# Patient Record
Sex: Male | Born: 1945 | Race: White | Hispanic: No | Smoking: Former smoker
Health system: Southern US, Community
[De-identification: ages and names within clinical notes are randomized; demographics above are authoritative.]

## PROBLEM LIST (undated history)

## (undated) DIAGNOSIS — H3530 Unspecified macular degeneration: Secondary | ICD-10-CM

## (undated) DIAGNOSIS — Z8739 Personal history of other diseases of the musculoskeletal system and connective tissue: Secondary | ICD-10-CM

## (undated) DIAGNOSIS — Z9109 Other allergy status, other than to drugs and biological substances: Secondary | ICD-10-CM

## (undated) DIAGNOSIS — M1990 Unspecified osteoarthritis, unspecified site: Secondary | ICD-10-CM

## (undated) DIAGNOSIS — Z87442 Personal history of urinary calculi: Secondary | ICD-10-CM

## (undated) DIAGNOSIS — L309 Dermatitis, unspecified: Secondary | ICD-10-CM

## (undated) HISTORY — DX: Unspecified macular degeneration: H35.30

## (undated) HISTORY — PX: OTHER SURGICAL HISTORY: SHX169

---

## 1998-11-02 ENCOUNTER — Ambulatory Visit (HOSPITAL_COMMUNITY): Admission: RE | Admit: 1998-11-02 | Discharge: 1998-11-02 | Payer: Self-pay | Admitting: Gastroenterology

## 1998-11-02 ENCOUNTER — Encounter (INDEPENDENT_AMBULATORY_CARE_PROVIDER_SITE_OTHER): Payer: Self-pay | Admitting: Specialist

## 2003-08-31 ENCOUNTER — Encounter: Admission: RE | Admit: 2003-08-31 | Discharge: 2003-08-31 | Payer: Self-pay | Admitting: Gastroenterology

## 2007-10-07 ENCOUNTER — Ambulatory Visit (HOSPITAL_COMMUNITY): Admission: RE | Admit: 2007-10-07 | Discharge: 2007-10-07 | Payer: Self-pay | Admitting: Gastroenterology

## 2007-10-07 ENCOUNTER — Encounter (INDEPENDENT_AMBULATORY_CARE_PROVIDER_SITE_OTHER): Payer: Self-pay | Admitting: Gastroenterology

## 2010-09-10 NOTE — Op Note (Signed)
Clifford Garcia, Clifford Garcia             ACCOUNT NO.:  [id number]   MEDICAL RECORD NO.:  [id number]          PATIENT TYPE:  AMB   LOCATION:  ENDO                         FACILITY:  [facility]   PHYSICIAN:  Bernette Redbird, M.D.   DATE OF BIRTH:  03/18/1946   DATE OF PROCEDURE:  10/07/2007  DATE OF DISCHARGE:                               OPERATIVE REPORT   PROCEDURE:  Colonoscopy with polypectomy.   ENDOSCOPIST:  Bernette Redbird, M.D.   INDICATIONS:  A 65 year old for followup of prior colonic adenomas, last  colonoscoped 4-1/2 years ago.  More recently, he had had some rectal  bleeding which has now become quiescent.   FINDINGS:  Small polyp in the transverse colon, snared off, but main  portion of polyp lost.  Mild right-sided diverticulosis.   PROCEDURE:  The nature, purpose and risks of the procedure were familiar  to the patient from prior examination and he provided written consent.  Sedation was propofol by the anesthesia department.  Digital exam of the  prostate showed no discrete nodules or abnormalities.  The Pentax adult  video colonoscope was advanced very easily around the colon to the cecum  and for short distance into a normal-appearing terminal ileum, whereupon  pullback was performed.  The quality of prep was very good and it was  felt that all areas were well seen.   At 70 cm on pullback, felt to correspond to the distal transverse colon,  there was a 4- to 5-mm sessile polyp which I cold-snared.  The polyp  fragment was lost because it floated away and was not able to be  recovered, at least as of the time of this dictation, although the  technician was going to check the large suction trap to see if it may  have gone into it instead of into the small suction trap.  A little bit  of residual tissue, possibly residual polyp tissue, at the base of the  cold snare site was cold-biopsied off and submitted for histologic  analysis.  If located, we will also send the main  portion of the polyp,  which is still just a small fragment of tissue, along with that  specimen.   There were a few right-sided diverticula.   This was otherwise a normal exam.  No large polyps, cancer, colitis or  vascular malformations were noted and retroflexion in the rectum was  unremarkable.  The patient was re-colonoscoped all the way to the cecum  in an effort to locate the small lost polyp fragment, but this was not  successful.   The patient tolerated the procedure well and there were no apparent  complications.   IMPRESSION:  1. Small colon polyp removed by cold snare technique as described      above.  2. Prior history of colonic adenomas.  3. Mild right-sided diverticulosis as previously noted.   PLAN:  1. Await pathology results.  2. Anticipate followup colonoscopy in 5 years, in view of the prior      history of colonic adenomas.           ______________________________  Bernette Redbird, M.D.  RB/MEDQ  D:  10/07/2007  T:  10/07/2007  Job:  [id number]   cc:   Excell Seltzer. Annabell Howells, M.D.  Fax: [phone]

## 2011-01-23 LAB — BASIC METABOLIC PANEL
Calcium: 9.3
Chloride: 104
GFR calc non Af Amer: 58 — ABNORMAL LOW

## 2011-01-23 LAB — CBC
Hemoglobin: 16.6
MCHC: 35.8
RBC: 5.34
RDW: 13
WBC: 6

## 2012-02-27 ENCOUNTER — Other Ambulatory Visit: Payer: Self-pay | Admitting: Dermatology

## 2013-09-05 ENCOUNTER — Other Ambulatory Visit: Payer: Self-pay | Admitting: Gastroenterology

## 2013-10-07 ENCOUNTER — Encounter (HOSPITAL_COMMUNITY): Payer: Self-pay | Admitting: Pharmacy Technician

## 2013-10-13 ENCOUNTER — Encounter (HOSPITAL_COMMUNITY)
Admission: RE | Admit: 2013-10-13 | Discharge: 2013-10-13 | Disposition: A | Discharge status: Home / Self Care | Payer: BC Managed Care – PPO | Source: Ambulatory Visit | Attending: Orthopedic Surgery | Admitting: Orthopedic Surgery

## 2013-10-13 ENCOUNTER — Encounter (HOSPITAL_COMMUNITY): Payer: Self-pay

## 2013-10-13 DIAGNOSIS — Z01818 Encounter for other preprocedural examination: Secondary | ICD-10-CM | POA: Insufficient documentation

## 2013-10-13 DIAGNOSIS — Z01812 Encounter for preprocedural laboratory examination: Secondary | ICD-10-CM | POA: Insufficient documentation

## 2013-10-13 HISTORY — DX: Other allergy status, other than to drugs and biological substances: Z91.09

## 2013-10-13 HISTORY — DX: Personal history of other diseases of the musculoskeletal system and connective tissue: Z87.39

## 2013-10-13 HISTORY — DX: Unspecified osteoarthritis, unspecified site: M19.90

## 2013-10-13 HISTORY — DX: Dermatitis, unspecified: L30.9

## 2013-10-13 HISTORY — DX: Personal history of urinary calculi: Z87.442

## 2013-10-13 LAB — URINALYSIS, ROUTINE W REFLEX MICROSCOPIC
Bilirubin Urine: NEGATIVE
GLUCOSE, UA: NEGATIVE mg/dL
HGB URINE DIPSTICK: NEGATIVE
KETONES UR: NEGATIVE mg/dL
Leukocytes, UA: NEGATIVE
NITRITE: NEGATIVE
Protein, ur: NEGATIVE mg/dL
Specific Gravity, Urine: 1.015 (ref 1.005–1.030)
UROBILINOGEN UA: 0.2 mg/dL (ref 0.0–1.0)
pH: 5.5 (ref 5.0–8.0)

## 2013-10-13 LAB — PROTIME-INR
INR: 1 (ref 0.00–1.49)
PROTHROMBIN TIME: 13 s (ref 11.6–15.2)

## 2013-10-13 LAB — CBC
HCT: 43.6 % (ref 39.0–52.0)
HEMOGLOBIN: 15 g/dL (ref 13.0–17.0)
MCH: 31.1 pg (ref 26.0–34.0)
MCHC: 34.4 g/dL (ref 30.0–36.0)
MCV: 90.3 fL (ref 78.0–100.0)
Platelets: 143 10*3/uL — ABNORMAL LOW (ref 150–400)
RBC: 4.83 MIL/uL (ref 4.22–5.81)
RDW: 12.6 % (ref 11.5–15.5)
WBC: 5.5 10*3/uL (ref 4.0–10.5)

## 2013-10-13 LAB — BASIC METABOLIC PANEL
BUN: 17 mg/dL (ref 6–23)
CALCIUM: 9.7 mg/dL (ref 8.4–10.5)
CHLORIDE: 103 meq/L (ref 96–112)
CO2: 26 mEq/L (ref 19–32)
Creatinine, Ser: 1.13 mg/dL (ref 0.50–1.35)
GFR calc Af Amer: 74 mL/min — ABNORMAL LOW (ref >90)
GFR, EST NON AFRICAN AMERICAN: 64 mL/min — AB (ref >90)
GLUCOSE: 124 mg/dL — AB (ref 70–99)
POTASSIUM: 4.1 meq/L (ref 3.7–5.3)
SODIUM: 145 meq/L (ref 137–147)

## 2013-10-13 LAB — SURGICAL PCR SCREEN
MRSA, PCR: NEGATIVE
Staphylococcus aureus: NEGATIVE

## 2013-10-13 LAB — APTT: aPTT: 32 seconds (ref 24–37)

## 2013-10-13 NOTE — Patient Instructions (Addendum)
Clifford Garcia  10/13/2013                           YOUR PROCEDURE IS SCHEDULED ON: 10/18/13 at 7:15 AM               ENTER THRU [facility] MAIN HOSPITAL ENTRANCE AND                            FOLLOW  SIGNS TO SHORT STAY CENTER                 ARRIVE AT SHORT STAY AT: 5:15 AM               CALL THIS NUMBER IF ANY PROBLEMS THE DAY OF SURGERY :               [phone]                                REMEMBER:   Do not eat food or drink liquids AFTER MIDNIGHT                 Take these medicines the morning of surgery with               A SIPS OF WATER :   NONE      Do not wear jewelry, make-up   Do not wear lotions, powders, or perfumes.   Do not shave legs or underarms 12 hrs. before surgery (men may shave face)  Do not bring valuables to the hospital.  Contacts, dentures or bridgework may not be worn into surgery.  Leave suitcase in the car. After surgery it may be brought to your room.  For patients admitted to the hospital more than one night, checkout time is            11:00 AM                                                       ________________________________________________________________________                                                               [place] - PREPARING FOR SURGERY  Before surgery, you can play an important role.  Because skin is not sterile, your skin needs to be as free of germs as possible.  You can reduce the number of germs on your skin by washing with CHG (chlorahexidine gluconate) soap before surgery.  CHG is an antiseptic cleaner which kills germs and bonds with the skin to continue killing germs even after washing. Please DO NOT use if you have an allergy to CHG or antibacterial soaps.  If your skin becomes reddened/irritated stop using the CHG and inform your nurse when you arrive at Short Stay. Do not shave (including legs and underarms) for at least 48 hours prior to the first CHG shower.  You may shave your  face. Please follow these instructions carefully:  1.  Shower with CHG Soap the night before surgery and the  morning of Surgery.   2.  If you choose to wash your hair, wash your hair first as usual with your  normal  Shampoo.   3.  After you shampoo, rinse your hair and body thoroughly to remove the  shampoo.                                         4.  Use CHG as you would any other liquid soap.  You can apply chg directly  to the skin and wash . Gently wash with scrungie or clean wascloth    5.  Apply the CHG Soap to your body ONLY FROM THE NECK DOWN.   Do not use on open                           Wound or open sores. Avoid contact with eyes, ears mouth and genitals (private parts).                        Genitals (private parts) with your normal soap.              6.  Wash thoroughly, paying special attention to the area where your surgery  will be performed.   7.  Thoroughly rinse your body with warm water from the neck down.   8.  DO NOT shower/wash with your normal soap after using and rinsing off  the CHG Soap .                9.  Pat yourself dry with a clean towel.             10.  Wear clean pajamas.             11.  Place clean sheets on your bed the night of your first shower and do not  sleep with pets.  Day of Surgery : Do not apply any lotions/deodorants the morning of surgery.  Please wear clean clothes to the hospital/surgery center.  FAILURE TO FOLLOW THESE INSTRUCTIONS MAY RESULT IN THE CANCELLATION OF YOUR SURGERY    PATIENT SIGNATURE_________________________________                                             Incentive Spirometer  An incentive spirometer is a tool that can help keep your lungs clear and active. This tool measures how well you are filling your lungs with each breath. Taking long deep breaths may help reverse or decrease the chance of developing breathing (pulmonary) problems (especially infection) following:  A long period of time  when you are unable to move or be active. BEFORE THE PROCEDURE   If the spirometer includes an indicator to show your best effort, your nurse or respiratory therapist will set it to a desired goal.  If possible, sit up straight or lean slightly forward. Try not to slouch.  Hold the incentive spirometer in an upright position. INSTRUCTIONS FOR USE  1. Sit on the edge of your bed if possible, or sit up as far as you can in bed or on a chair. 2. Hold the incentive spirometer in an  upright position. 3. Breathe out normally. 4. Place the mouthpiece in your mouth and seal your lips tightly around it. 5. Breathe in slowly and as deeply as possible, raising the piston or the ball toward the top of the column. 6. Hold your breath for 3-5 seconds or for as long as possible. Allow the piston or ball to fall to the bottom of the column. 7. Remove the mouthpiece from your mouth and breathe out normally. 8. Rest for a few seconds and repeat Steps 1 through 7 at least 10 times every 1-2 hours when you are awake. Take your time and take a few normal breaths between deep breaths. 9. The spirometer may include an indicator to show your best effort. Use the indicator as a goal to work toward during each repetition. 10. After each set of 10 deep breaths, practice coughing to be sure your lungs are clear. If you have an incision (the cut made at the time of surgery), support your incision when coughing by placing a pillow or rolled up towels firmly against it. Once you are able to get out of bed, walk around indoors and cough well. You may stop using the incentive spirometer when instructed by your caregiver.  RISKS AND COMPLICATIONS  Take your time so you do not get dizzy or light-headed.  If you are in pain, you may need to take or ask for pain medication before doing incentive spirometry. It is harder to take a deep breath if you are having pain. AFTER USE  Rest and breathe slowly and easily.  It can be  helpful to keep track of a log of your progress. Your caregiver can provide you with a simple table to help with this. If you are using the spirometer at home, follow these instructions: SEEK MEDICAL CARE IF:   You are having difficultly using the spirometer.  You have trouble using the spirometer as often as instructed.  Your pain medication is not giving enough relief while using the spirometer.  You develop fever of 100.5 F (38.1 C) or higher. SEEK IMMEDIATE MEDICAL CARE IF:   You cough up bloody sputum that had not been present before.  You develop fever of 102 F (38.9 C) or greater.  You develop worsening pain at or near the incision site. MAKE SURE YOU:   Understand these instructions.  Will watch your condition.  Will get help right away if you are not doing well or get worse. Document Released: 08/25/2006 Document Revised: 07/07/2011 Document Reviewed: 10/26/2006 [employer] Patient Information 2014 [employer], [place].   ________________________________________________________________________  WHAT IS A BLOOD TRANSFUSION? Blood Transfusion Information  A transfusion is the replacement of blood or some of its parts. Blood is made up of multiple cells which provide different functions.  Red blood cells carry oxygen and are used for blood loss replacement.  White blood cells fight against infection.  Platelets control bleeding.  Plasma helps clot blood.  Other blood products are available for specialized needs, such as hemophilia or other clotting disorders. BEFORE THE TRANSFUSION  Who gives blood for transfusions?   Healthy volunteers who are fully evaluated to make sure their blood is safe. This is blood bank blood. Transfusion therapy is the safest it has ever been in the practice of medicine. Before blood is taken from a donor, a complete history is taken to make sure that person has no history of diseases nor engages in risky social behavior (examples are  intravenous drug use or sexual activity with  multiple partners). The donor's travel history is screened to minimize risk of transmitting infections, such as malaria. The donated blood is tested for signs of infectious diseases, such as HIV and hepatitis. The blood is then tested to be sure it is compatible with you in order to minimize the chance of a transfusion reaction. If you or a relative donates blood, this is often done in anticipation of surgery and is not appropriate for emergency situations. It takes many days to process the donated blood. RISKS AND COMPLICATIONS Although transfusion therapy is very safe and saves many lives, the main dangers of transfusion include:   Getting an infectious disease.  Developing a transfusion reaction. This is an allergic reaction to something in the blood you were given. Every precaution is taken to prevent this. The decision to have a blood transfusion has been considered carefully by your caregiver before blood is given. Blood is not given unless the benefits outweigh the risks. AFTER THE TRANSFUSION  Right after receiving a blood transfusion, you will usually feel much better and more energetic. This is especially true if your red blood cells have gotten low (anemic). The transfusion raises the level of the red blood cells which carry oxygen, and this usually causes an energy increase.  The nurse administering the transfusion will monitor you carefully for complications. HOME CARE INSTRUCTIONS  No special instructions are needed after a transfusion. You may find your energy is better. Speak with your caregiver about any limitations on activity for underlying diseases you may have. SEEK MEDICAL CARE IF:   Your condition is not improving after your transfusion.  You develop redness or irritation at the intravenous (IV) site. SEEK IMMEDIATE MEDICAL CARE IF:  Any of the following symptoms occur over the next 12 hours:  Shaking chills.  You have a  temperature by mouth above 102 F (38.9 C), not controlled by medicine.  Chest, back, or muscle pain.  People around you feel you are not acting correctly or are confused.  Shortness of breath or difficulty breathing.  Dizziness and fainting.  You get a rash or develop hives.  You have a decrease in urine output.  Your urine turns a dark color or changes to pink, red, or brown. Any of the following symptoms occur over the next 10 days:  You have a temperature by mouth above 102 F (38.9 C), not controlled by medicine.  Shortness of breath.  Weakness after normal activity.  The white part of the eye turns yellow (jaundice).  You have a decrease in the amount of urine or are urinating less often.  Your urine turns a dark color or changes to pink, red, or brown. Document Released: 04/11/2000 Document Revised: 07/07/2011 Document Reviewed: 11/29/2007 [facility] Patient Information 2014 [place], [place].  _______________________________________________________________________

## 2013-10-14 NOTE — H&P (Signed)
TOTAL HIP ADMISSION H&P  Patient is admitted for right total hip arthroplasty, anterior approach.  Subjective:  Chief Complaint:   Right hip OA / pain  HPI: Clifford MortonPeter A Aloisi, 68 y.o. male, has a history of pain and functional disability in the right hip(s) due to arthritis and patient has failed non-surgical conservative treatments for greater than 12 weeks to include NSAID's and/or analgesics and activity modification.  Onset of symptoms was gradual starting >10 years ago with gradually worsening course since that time.The patient noted no past surgery on the right hip(s).  Patient currently rates pain in the right hip at 7 out of 10 with activity. Patient has night pain, worsening of pain with activity and weight bearing, trendelenberg gait, pain that interfers with activities of daily living and pain with passive range of motion. Patient has evidence of periarticular osteophytes and joint space narrowing by imaging studies. This condition presents safety issues increasing the risk of falls.  There is no current active infection.  Risks, benefits and expectations were discussed with the patient.  Risks including but not limited to the risk of anesthesia, blood clots, nerve damage, blood vessel damage, failure of the prosthesis, infection and up to and including death.  Patient understand the risks, benefits and expectations and wishes to proceed with surgery.   PCP: No PCP Per Patient  D/C Plans:      Home with HHPT  Post-op Meds:       No Rx given  Tranexamic Acid:      Is to be given  Decadron:      Is to be given  FYI:     ASA post-op  Norco post-op    Past Medical History  Diagnosis Date  . Environmental allergies   . Arthritis   . History of gout     X 1 EPISODE AGE 68  . Eczema   . History of kidney stones     Past Surgical History  Procedure Laterality Date  . Cataracts removed      No prescriptions prior to admission   Allergies  Allergen Reactions  . Tetracyclines  & Related Diarrhea    Bloated stomach     History  Substance Use Topics  . Smoking status: Former Smoker    Quit date: 10/13/1984  . Smokeless tobacco: Not on file  . Alcohol Use: Yes     Comment: OCCASIONAL       Review of Systems  Constitutional: Negative.   HENT: Positive for hearing loss.   Eyes: Negative.   Respiratory: Negative.   Cardiovascular: Negative.   Gastrointestinal: Negative.   Genitourinary: Negative.   Musculoskeletal: Positive for joint pain.  Skin: Positive for rash.  Neurological: Negative.   Endo/Heme/Allergies: Positive for environmental allergies.  Psychiatric/Behavioral: Negative.     Objective:  Physical Exam  Constitutional: He is oriented to person, place, and time. He appears well-developed and well-nourished.  HENT:  Head: Normocephalic and atraumatic.  Mouth/Throat: Oropharynx is clear and moist.  Eyes: Pupils are equal, round, and reactive to light.  Neck: Neck supple. No JVD present. No tracheal deviation present. No thyromegaly present.  Cardiovascular: Normal rate, regular rhythm, normal heart sounds and intact distal pulses.   Respiratory: Effort normal and breath sounds normal. No stridor. No respiratory distress. He has no wheezes.  GI: Soft. There is no tenderness. There is no guarding.  Musculoskeletal:       Right hip: He exhibits decreased range of motion, decreased strength, tenderness and bony  tenderness. He exhibits no swelling, no deformity and no laceration.  Lymphadenopathy:    He has no cervical adenopathy.  Neurological: He is alert and oriented to person, place, and time.  Skin: Skin is warm and dry.  Psychiatric: He has a normal mood and affect.     Imaging Review Plain radiographs demonstrate severe degenerative joint disease of the right hip(s). The bone quality appears to be good for age and reported activity level.  Assessment/Plan:  End stage arthritis, right hip(s)  The patient history, physical  examination, clinical judgement of the Nashae Maudlin and imaging studies are consistent with end stage degenerative joint disease of the right hip(s) and total hip arthroplasty is deemed medically necessary. The treatment options including medical management, injection therapy, arthroscopy and arthroplasty were discussed at length. The risks and benefits of total hip arthroplasty were presented and reviewed. The risks due to aseptic loosening, infection, stiffness, dislocation/subluxation,  thromboembolic complications and other imponderables were discussed.  The patient acknowledged the explanation, agreed to proceed with the plan and consent was signed. Patient is being admitted for inpatient treatment for surgery, pain control, PT, OT, prophylactic antibiotics, VTE prophylaxis, progressive ambulation and ADL's and discharge planning.The patient is planning to be discharged home with home health services.    Anastasio AuerbachMatthew S. Babish   PA-C  10/14/2013, 2:00 PM

## 2013-10-18 ENCOUNTER — Encounter (HOSPITAL_COMMUNITY): Payer: Self-pay | Admitting: *Deleted

## 2013-10-18 ENCOUNTER — Encounter (HOSPITAL_COMMUNITY): Payer: BC Managed Care – PPO | Admitting: Certified Registered"

## 2013-10-18 ENCOUNTER — Encounter (HOSPITAL_COMMUNITY)
Admission: RE | Disposition: A | Discharge status: Home Health | Payer: Self-pay | Source: Ambulatory Visit | Attending: Orthopedic Surgery

## 2013-10-18 ENCOUNTER — Inpatient Hospital Stay (HOSPITAL_COMMUNITY): Payer: BC Managed Care – PPO

## 2013-10-18 ENCOUNTER — Inpatient Hospital Stay (HOSPITAL_COMMUNITY): Payer: BC Managed Care – PPO | Admitting: Certified Registered"

## 2013-10-18 ENCOUNTER — Inpatient Hospital Stay (HOSPITAL_COMMUNITY)
Admission: RE | Admit: 2013-10-18 | Discharge: 2013-10-20 | DRG: 470 | Disposition: A | Discharge status: Home Health | Payer: BC Managed Care – PPO | Source: Ambulatory Visit | Attending: Orthopedic Surgery | Admitting: Orthopedic Surgery

## 2013-10-18 DIAGNOSIS — D500 Iron deficiency anemia secondary to blood loss (chronic): Secondary | ICD-10-CM | POA: Diagnosis not present

## 2013-10-18 DIAGNOSIS — D62 Acute posthemorrhagic anemia: Secondary | ICD-10-CM | POA: Diagnosis not present

## 2013-10-18 DIAGNOSIS — M169 Osteoarthritis of hip, unspecified: Secondary | ICD-10-CM | POA: Diagnosis present

## 2013-10-18 DIAGNOSIS — M25559 Pain in unspecified hip: Secondary | ICD-10-CM | POA: Diagnosis present

## 2013-10-18 DIAGNOSIS — Z01812 Encounter for preprocedural laboratory examination: Secondary | ICD-10-CM | POA: Diagnosis not present

## 2013-10-18 DIAGNOSIS — M1610 Unilateral primary osteoarthritis, unspecified hip: Principal | ICD-10-CM | POA: Diagnosis present

## 2013-10-18 DIAGNOSIS — Z6825 Body mass index (BMI) 25.0-25.9, adult: Secondary | ICD-10-CM

## 2013-10-18 DIAGNOSIS — Z87442 Personal history of urinary calculi: Secondary | ICD-10-CM

## 2013-10-18 DIAGNOSIS — Z6826 Body mass index (BMI) 26.0-26.9, adult: Secondary | ICD-10-CM | POA: Diagnosis not present

## 2013-10-18 DIAGNOSIS — H9190 Unspecified hearing loss, unspecified ear: Secondary | ICD-10-CM | POA: Diagnosis present

## 2013-10-18 DIAGNOSIS — Z96649 Presence of unspecified artificial hip joint: Secondary | ICD-10-CM

## 2013-10-18 DIAGNOSIS — E663 Overweight: Secondary | ICD-10-CM | POA: Diagnosis present

## 2013-10-18 DIAGNOSIS — Z87891 Personal history of nicotine dependence: Secondary | ICD-10-CM

## 2013-10-18 HISTORY — PX: TOTAL HIP ARTHROPLASTY: SHX124

## 2013-10-18 LAB — TYPE AND SCREEN
ABO/RH(D): A POS
ANTIBODY SCREEN: NEGATIVE

## 2013-10-18 LAB — ABO/RH: ABO/RH(D): A POS

## 2013-10-18 SURGERY — ARTHROPLASTY, HIP, TOTAL, ANTERIOR APPROACH
Anesthesia: Spinal | Site: Hip | Laterality: Right

## 2013-10-18 MED ORDER — PROPOFOL 10 MG/ML IV BOLUS
INTRAVENOUS | Status: AC
  Filled 2013-10-18: qty 20

## 2013-10-18 MED ORDER — FENTANYL CITRATE 0.05 MG/ML IJ SOLN: 25.0000 ug | INTRAMUSCULAR | Status: DC | PRN

## 2013-10-18 MED ORDER — FENTANYL CITRATE 0.05 MG/ML IJ SOLN
INTRAMUSCULAR | Status: DC | PRN
  Administered 2013-10-18 (×2): 50 ug via INTRAVENOUS

## 2013-10-18 MED ORDER — METOCLOPRAMIDE HCL 5 MG/ML IJ SOLN: 5.0000 mg | Freq: Three times a day (TID) | INTRAMUSCULAR | Status: DC | PRN

## 2013-10-18 MED ORDER — CEFAZOLIN SODIUM-DEXTROSE 2-3 GM-% IV SOLR
INTRAVENOUS | Status: AC
  Filled 2013-10-18: qty 50

## 2013-10-18 MED ORDER — DEXAMETHASONE SODIUM PHOSPHATE 10 MG/ML IJ SOLN
10.0000 mg | Freq: Once | INTRAMUSCULAR | Status: DC
  Filled 2013-10-18: qty 1

## 2013-10-18 MED ORDER — MENTHOL 3 MG MT LOZG: 1.0000 | LOZENGE | OROMUCOSAL | Status: DC | PRN

## 2013-10-18 MED ORDER — DEXAMETHASONE SODIUM PHOSPHATE 10 MG/ML IJ SOLN
10.0000 mg | Freq: Once | INTRAMUSCULAR | Status: AC
  Administered 2013-10-18: 10 mg via INTRAVENOUS

## 2013-10-18 MED ORDER — ASPIRIN EC 325 MG PO TBEC
325.0000 mg | DELAYED_RELEASE_TABLET | Freq: Two times a day (BID) | ORAL | Status: DC
  Administered 2013-10-19 – 2013-10-20 (×3): 325 mg via ORAL
  Filled 2013-10-18 (×5): qty 1

## 2013-10-18 MED ORDER — ONDANSETRON HCL 4 MG/2ML IJ SOLN
INTRAMUSCULAR | Status: AC
  Filled 2013-10-18: qty 2

## 2013-10-18 MED ORDER — LIDOCAINE HCL (CARDIAC) 20 MG/ML IV SOLN
INTRAVENOUS | Status: AC
  Filled 2013-10-18: qty 5

## 2013-10-18 MED ORDER — ONDANSETRON HCL 4 MG/2ML IJ SOLN
INTRAMUSCULAR | Status: DC | PRN
  Administered 2013-10-18: 4 mg via INTRAVENOUS

## 2013-10-18 MED ORDER — CELECOXIB 200 MG PO CAPS
200.0000 mg | ORAL_CAPSULE | Freq: Two times a day (BID) | ORAL | Status: DC
  Filled 2013-10-18 (×5): qty 1

## 2013-10-18 MED ORDER — NYSTATIN 100000 UNIT/GM EX CREA
1.0000 "application " | TOPICAL_CREAM | Freq: Every day | CUTANEOUS | Status: DC
  Administered 2013-10-18 – 2013-10-19 (×2): 1 via TOPICAL
  Filled 2013-10-18: qty 15

## 2013-10-18 MED ORDER — CEFAZOLIN SODIUM-DEXTROSE 2-3 GM-% IV SOLR
2.0000 g | INTRAVENOUS | Status: AC
  Administered 2013-10-18: 2 g via INTRAVENOUS

## 2013-10-18 MED ORDER — PROMETHAZINE HCL 25 MG/ML IJ SOLN: 6.2500 mg | INTRAMUSCULAR | Status: DC | PRN

## 2013-10-18 MED ORDER — PHENOL 1.4 % MT LIQD
1.0000 | OROMUCOSAL | Status: DC | PRN
  Administered 2013-10-20: 1 via OROMUCOSAL
  Filled 2013-10-18: qty 177

## 2013-10-18 MED ORDER — SODIUM CHLORIDE 0.9 % IR SOLN
Status: DC | PRN
Start: 2013-10-18 — End: 2013-10-18
  Administered 2013-10-18: 1000 mL

## 2013-10-18 MED ORDER — FENTANYL CITRATE 0.05 MG/ML IJ SOLN
INTRAMUSCULAR | Status: AC
  Filled 2013-10-18: qty 2

## 2013-10-18 MED ORDER — HYDROMORPHONE HCL PF 1 MG/ML IJ SOLN: 0.5000 mg | INTRAMUSCULAR | Status: DC | PRN

## 2013-10-18 MED ORDER — DOCUSATE SODIUM 100 MG PO CAPS
100.0000 mg | ORAL_CAPSULE | Freq: Two times a day (BID) | ORAL | Status: DC
  Administered 2013-10-18 – 2013-10-20 (×4): 100 mg via ORAL
  Filled 2013-10-18 (×6): qty 1

## 2013-10-18 MED ORDER — ALUM & MAG HYDROXIDE-SIMETH 200-200-20 MG/5ML PO SUSP
30.0000 mL | ORAL | Status: DC | PRN
  Administered 2013-10-18 – 2013-10-19 (×3): 30 mL via ORAL
  Filled 2013-10-18 (×4): qty 30

## 2013-10-18 MED ORDER — SODIUM CHLORIDE 0.9 % IV SOLN
100.0000 mL/h | INTRAVENOUS | Status: DC
  Administered 2013-10-18: 100 mL/h via INTRAVENOUS
  Filled 2013-10-18 (×4): qty 1000

## 2013-10-18 MED ORDER — ONDANSETRON HCL 4 MG PO TABS: 4.0000 mg | ORAL_TABLET | Freq: Four times a day (QID) | ORAL | Status: DC | PRN

## 2013-10-18 MED ORDER — LIDOCAINE HCL (CARDIAC) 20 MG/ML IV SOLN
INTRAVENOUS | Status: DC | PRN
  Administered 2013-10-18: 50 mg via INTRAVENOUS

## 2013-10-18 MED ORDER — METHOCARBAMOL 500 MG PO TABS
500.0000 mg | ORAL_TABLET | Freq: Four times a day (QID) | ORAL | Status: DC | PRN
  Administered 2013-10-19 – 2013-10-20 (×5): 500 mg via ORAL
  Filled 2013-10-18 (×6): qty 1

## 2013-10-18 MED ORDER — POLYETHYLENE GLYCOL 3350 17 G PO PACK
17.0000 g | PACK | Freq: Two times a day (BID) | ORAL | Status: DC
  Administered 2013-10-19 – 2013-10-20 (×3): 17 g via ORAL
  Filled 2013-10-18 (×6): qty 1

## 2013-10-18 MED ORDER — MIDAZOLAM HCL 5 MG/5ML IJ SOLN
INTRAMUSCULAR | Status: DC | PRN
  Administered 2013-10-18: 2 mg via INTRAVENOUS

## 2013-10-18 MED ORDER — HYDROCORTISONE 1 % EX OINT
TOPICAL_OINTMENT | Freq: Every day | CUTANEOUS | Status: DC
  Administered 2013-10-18 – 2013-10-20 (×3): via TOPICAL
  Filled 2013-10-18: qty 28.35

## 2013-10-18 MED ORDER — MEPERIDINE HCL 50 MG/ML IJ SOLN: 6.2500 mg | INTRAMUSCULAR | Status: DC | PRN

## 2013-10-18 MED ORDER — PROPOFOL INFUSION 10 MG/ML OPTIME
INTRAVENOUS | Status: DC | PRN
  Administered 2013-10-18: 100 ug/kg/min via INTRAVENOUS

## 2013-10-18 MED ORDER — METOCLOPRAMIDE HCL 10 MG PO TABS: 5.0000 mg | ORAL_TABLET | Freq: Three times a day (TID) | ORAL | Status: DC | PRN

## 2013-10-18 MED ORDER — TOBRAMYCIN-DEXAMETHASONE 0.3-0.1 % OP SUSP
1.0000 [drp] | Freq: Every morning | OPHTHALMIC | Status: DC
  Administered 2013-10-18 – 2013-10-20 (×3): 1 [drp] via OPHTHALMIC
  Filled 2013-10-18: qty 2.5

## 2013-10-18 MED ORDER — BISACODYL 10 MG RE SUPP: 10.0000 mg | Freq: Every day | RECTAL | Status: DC | PRN

## 2013-10-18 MED ORDER — MIDAZOLAM HCL 2 MG/2ML IJ SOLN
INTRAMUSCULAR | Status: AC
  Filled 2013-10-18: qty 2

## 2013-10-18 MED ORDER — CHLORHEXIDINE GLUCONATE 4 % EX LIQD: 60.0000 mL | Freq: Once | CUTANEOUS | Status: DC

## 2013-10-18 MED ORDER — TRANEXAMIC ACID 100 MG/ML IV SOLN
1000.0000 mg | Freq: Once | INTRAVENOUS | Status: AC
  Administered 2013-10-18: 1000 mg via INTRAVENOUS
  Filled 2013-10-18: qty 10

## 2013-10-18 MED ORDER — DEXAMETHASONE SODIUM PHOSPHATE 10 MG/ML IJ SOLN
INTRAMUSCULAR | Status: AC
  Filled 2013-10-18: qty 1

## 2013-10-18 MED ORDER — BUPIVACAINE HCL (PF) 0.5 % IJ SOLN
INTRAMUSCULAR | Status: DC | PRN
  Administered 2013-10-18: 3 mL

## 2013-10-18 MED ORDER — MAGNESIUM CITRATE PO SOLN: 1.0000 | Freq: Once | ORAL | Status: AC | PRN

## 2013-10-18 MED ORDER — FERROUS SULFATE 325 (65 FE) MG PO TABS
325.0000 mg | ORAL_TABLET | Freq: Three times a day (TID) | ORAL | Status: DC
  Administered 2013-10-19 – 2013-10-20 (×2): 325 mg via ORAL
  Filled 2013-10-18 (×9): qty 1

## 2013-10-18 MED ORDER — CEFAZOLIN SODIUM-DEXTROSE 2-3 GM-% IV SOLR
2.0000 g | Freq: Four times a day (QID) | INTRAVENOUS | Status: AC
  Administered 2013-10-18 (×2): 2 g via INTRAVENOUS
  Filled 2013-10-18 (×2): qty 50

## 2013-10-18 MED ORDER — METHOCARBAMOL 1000 MG/10ML IJ SOLN
500.0000 mg | Freq: Four times a day (QID) | INTRAVENOUS | Status: DC | PRN
  Administered 2013-10-18: 500 mg via INTRAVENOUS
  Filled 2013-10-18: qty 5

## 2013-10-18 MED ORDER — ONDANSETRON HCL 4 MG/2ML IJ SOLN: 4.0000 mg | Freq: Four times a day (QID) | INTRAMUSCULAR | Status: DC | PRN

## 2013-10-18 MED ORDER — EPHEDRINE SULFATE 50 MG/ML IJ SOLN
INTRAMUSCULAR | Status: DC | PRN
  Administered 2013-10-18 (×4): 5 mg via INTRAVENOUS

## 2013-10-18 MED ORDER — METRONIDAZOLE 0.75 % EX LOTN
1.0000 "application " | TOPICAL_LOTION | Freq: Every day | CUTANEOUS | Status: DC
  Administered 2013-10-18 – 2013-10-19 (×2): 1 via CUTANEOUS

## 2013-10-18 MED ORDER — HYDROCODONE-ACETAMINOPHEN 7.5-325 MG PO TABS
1.0000 | ORAL_TABLET | ORAL | Status: DC
  Administered 2013-10-18 – 2013-10-19 (×7): 2 via ORAL
  Administered 2013-10-20: 1 via ORAL
  Administered 2013-10-20: 2 via ORAL
  Filled 2013-10-18 (×9): qty 2

## 2013-10-18 MED ORDER — LACTATED RINGERS IV SOLN
INTRAVENOUS | Status: DC
  Administered 2013-10-18: 11:00:00 via INTRAVENOUS

## 2013-10-18 MED ORDER — BUPIVACAINE HCL (PF) 0.5 % IJ SOLN
INTRAMUSCULAR | Status: AC
  Filled 2013-10-18: qty 30

## 2013-10-18 MED ORDER — LACTATED RINGERS IV SOLN
INTRAVENOUS | Status: DC | PRN
  Administered 2013-10-18 (×3): via INTRAVENOUS

## 2013-10-18 MED ORDER — DIPHENHYDRAMINE HCL 25 MG PO CAPS
25.0000 mg | ORAL_CAPSULE | Freq: Four times a day (QID) | ORAL | Status: DC | PRN
Start: 2013-10-18 — End: 2013-10-20

## 2013-10-18 SURGICAL SUPPLY — 41 items
ADH SKN CLS APL DERMABOND .7 (GAUZE/BANDAGES/DRESSINGS) ×1
BAG SPEC THK2 15X12 ZIP CLS (MISCELLANEOUS)
BAG ZIPLOCK 12X15 (MISCELLANEOUS) IMPLANT
CAPT HIP PF COP ×2 IMPLANT
COVER PERINEAL POST (MISCELLANEOUS) ×3 IMPLANT
DERMABOND ADVANCED (GAUZE/BANDAGES/DRESSINGS) ×2
DERMABOND ADVANCED .7 DNX12 (GAUZE/BANDAGES/DRESSINGS) ×1 IMPLANT
DRAPE C-ARM 42X120 X-RAY (DRAPES) ×3 IMPLANT
DRAPE STERI IOBAN 125X83 (DRAPES) ×3 IMPLANT
DRAPE U-SHAPE 47X51 STRL (DRAPES) ×9 IMPLANT
DRSG AQUACEL AG ADV 3.5X10 (GAUZE/BANDAGES/DRESSINGS) ×3 IMPLANT
DURAPREP 26ML APPLICATOR (WOUND CARE) ×3 IMPLANT
ELECT BLADE TIP CTD 4 INCH (ELECTRODE) ×3 IMPLANT
ELECT PENCIL ROCKER SW 15FT (MISCELLANEOUS) ×2 IMPLANT
ELECT REM PT RETURN 15FT ADLT (MISCELLANEOUS) IMPLANT
ELECT REM PT RETURN 9FT ADLT (ELECTROSURGICAL) ×3
ELECTRODE REM PT RTRN 9FT ADLT (ELECTROSURGICAL) ×1 IMPLANT
FACESHIELD WRAPAROUND (MASK) ×12 IMPLANT
FACESHIELD WRAPAROUND OR TEAM (MASK) ×4 IMPLANT
GLOVE BIOGEL PI IND STRL 7.5 (GLOVE) ×1 IMPLANT
GLOVE BIOGEL PI IND STRL 8 (GLOVE) ×1 IMPLANT
GLOVE BIOGEL PI INDICATOR 7.5 (GLOVE) ×4
GLOVE BIOGEL PI INDICATOR 8 (GLOVE) ×4
GLOVE ECLIPSE 8.0 STRL XLNG CF (GLOVE) ×7 IMPLANT
GLOVE ORTHO TXT STRL SZ7.5 (GLOVE) ×8 IMPLANT
GOWN SPEC L3 XXLG W/TWL (GOWN DISPOSABLE) ×7 IMPLANT
GOWN STRL REUS W/TWL LRG LVL3 (GOWN DISPOSABLE) ×3 IMPLANT
HOLDER FOLEY CATH W/STRAP (MISCELLANEOUS) ×3 IMPLANT
KIT BASIN OR (CUSTOM PROCEDURE TRAY) ×3 IMPLANT
PACK TOTAL JOINT (CUSTOM PROCEDURE TRAY) ×3 IMPLANT
PADDING CAST COTTON 6X4 STRL (CAST SUPPLIES) ×1 IMPLANT
SAW OSC TIP CART 19.5X105X1.3 (SAW) ×3 IMPLANT
SUT MNCRL AB 4-0 PS2 18 (SUTURE) ×3 IMPLANT
SUT VIC AB 1 CT1 36 (SUTURE) ×9 IMPLANT
SUT VIC AB 2-0 CT1 27 (SUTURE) ×6
SUT VIC AB 2-0 CT1 TAPERPNT 27 (SUTURE) ×2 IMPLANT
SUT VLOC 180 0 24IN GS25 (SUTURE) ×3 IMPLANT
TOWEL OR 17X26 10 PK STRL BLUE (TOWEL DISPOSABLE) ×3 IMPLANT
TOWEL OR NON WOVEN STRL DISP B (DISPOSABLE) ×2 IMPLANT
TRAY FOLEY CATH 14FRSI W/METER (CATHETERS) ×3 IMPLANT
WATER STERILE IRR 1500ML POUR (IV SOLUTION) ×3 IMPLANT

## 2013-10-18 NOTE — Transfer of Care (Signed)
Immediate Anesthesia Transfer of Care Note  Patient: Clifford Garcia  Procedure(s) Performed: Procedure(s): RIGHT TOTAL HIP ARTHROPLASTY ANTERIOR APPROACH (Right)  Patient Location: PACU  Anesthesia Type:Regional  Level of Consciousness: awake, alert  and oriented  Airway & Oxygen Therapy: Patient Spontanous Breathing and Patient connected to face mask oxygen  Post-op Assessment: Report given to PACU RN and Post -op Vital signs reviewed and stable  Post vital signs: Reviewed and stable  Complications: No apparent anesthesia complications

## 2013-10-18 NOTE — Op Note (Signed)
NAME:  Clifford Garcia                ACCOUNT NO.: [id number]      MEDICAL RECORD NO.: [id number]      FACILITY:  [facility]      PHYSICIAN:  Durene Romans D  DATE OF BIRTH:  08-24-1945     DATE OF PROCEDURE:  10/18/2013                                 OPERATIVE REPORT         PREOPERATIVE DIAGNOSIS: Right  hip osteoarthritis.      POSTOPERATIVE DIAGNOSIS:  Right hip osteoarthritis.      PROCEDURE:  Right total hip replacement through an anterior approach   utilizing DePuy THR system, component size 54mm pinnacle cup, a size 36+4 neutral   Altrex liner, a size 7 Hi Tri Lock stem with a 36+1.5 delta ceramic   ball.      SURGEON:  Madlyn Frankel. Charlann Boxer, M.D.      ASSISTANT:  Lanney Gins, PA-C      ANESTHESIA:  Spinal.      SPECIMENS:  None.      COMPLICATIONS:  None.      BLOOD LOSS:  400 cc     DRAINS:  None.      INDICATION OF THE PROCEDURE:  [name] is a 68 y.o. male who had   presented to office for evaluation of right hip pain.  Radiographs revealed   progressive degenerative changes with bone-on-bone   articulation to the  hip joint.  The patient had painful limited range of   motion significantly affecting their overall quality of life.  The patient was failing to    respond to conservative measures, and at this point was ready   to proceed with more definitive measures.  The patient has noted progressive   degenerative changes in his hip, progressive problems and dysfunction   with regarding the hip prior to surgery.  Consent was obtained for   benefit of pain relief.  Specific risk of infection, DVT, component   failure, dislocation, need for revision surgery, as well discussion of   the anterior versus posterior approach were reviewed.  Consent was   obtained for benefit of anterior pain relief through an anterior   approach.      PROCEDURE IN DETAIL:  The patient was brought to operative theater.   Once adequate anesthesia,  preoperative antibiotics, 2gm Ancef administered.   The patient was positioned supine on the OSI Hanna table.  Once adequate   padding of boney process was carried out, we had predraped out the hip, and  used fluoroscopy to confirm orientation of the pelvis and position.      The right hip was then prepped and draped from proximal iliac crest to   mid thigh with shower curtain technique.      Time-out was performed identifying the patient, planned procedure, and   extremity.     An incision was then made 2 cm distal and lateral to the   anterior superior iliac spine extending over the orientation of the   tensor fascia lata muscle and sharp dissection was carried down to the   fascia of the muscle and protractor placed in the soft tissues.      The fascia was then incised.  The muscle belly was identified and swept  laterally and retractor placed along the superior neck.  Following   cauterization of the circumflex vessels and removing some pericapsular   fat, a second cobra retractor was placed on the inferior neck.  A third   retractor was placed on the anterior acetabulum after elevating the   anterior rectus.  A L-capsulotomy was along the line of the   superior neck to the trochanteric fossa, then extended proximally and   distally.  Tag sutures were placed and the retractors were then placed   intracapsular.  We then identified the trochanteric fossa and   orientation of my neck cut, confirmed this radiographically   and then made a neck osteotomy with the femur on traction.  The femoral   head was removed without difficulty or complication.  Traction was let   off and retractors were placed posterior and anterior around the   acetabulum.      The labrum and foveal tissue were debrided.  I began reaming with a 47mm   reamer and reamed up to 53mm reamer with good bony bed preparation and a 54mm   cup was chosen.  The final 54mm Pinnacle cup was then impacted under fluoroscopy   to confirm the depth of penetration and orientation with respect to   abduction.  A screw was placed followed by the hole eliminator.  The final   36+4 neutral Altrex liner was impacted with good visualized rim fit.  The cup was positioned anatomically within the acetabular portion of the pelvis.      At this point, the femur was rolled at 80 degrees.  Further capsule was   released off the inferior aspect of the femoral neck.  I then   released the superior capsule proximally.  The hook was placed laterally   along the femur and elevated manually and held in position with the bed   hook.  The leg was then extended and adducted with the leg rolled to 100   degrees of external rotation.  Once the proximal femur was fully   exposed, I used a box osteotome to set orientation.  I then began   broaching with the starting chili pepper broach and passed this by hand and then broached up to 7.  With the 7 broach in place I chose a high offset neck and did a trial reduction.  The offset was appropriate, leg lengths   appeared to be equal, confirmed radiographically.   Given these findings, I went ahead and dislocated the hip, repositioned all   retractors and positioned the right hip in the extended and abducted position.  The final 7 Hi Tri Lock stem was   chosen and it was impacted down to the level of neck cut.  Based on this   and the trial reduction, a 36+1.5 delta ceramic ball was chosen and   impacted onto a clean and dry trunnion, and the hip was reduced.  The   hip had been irrigated throughout the case again at this point.  I did   reapproximate the superior capsular leaflet to the anterior leaflet   using #1 Vicryl.  The fascia of the   tensor fascia lata muscle was then reapproximated using #1 Vicryl.  The   remaining wound was closed with 2-0 Vicryl and running 4-0 Monocryl.   The hip was cleaned, dried, and dressed sterilely using Dermabond and   Aquacel dressing.  He was then brought    to recovery room in stable condition tolerating  the procedure well.    Lanney Gins, PA-C was present for the entirety of the case involved from   preoperative positioning, perioperative retractor management, general   facilitation of the case, as well as primary wound closure as assistant.            Madlyn Frankel Charlann Boxer, M.D.        10/18/2013 8:55 AM

## 2013-10-18 NOTE — Progress Notes (Signed)
Portable AP Pelvis and Lateral Right Hip X-rays done. 

## 2013-10-18 NOTE — Plan of Care (Signed)
Problem: Consults Goal: Diagnosis- Total Joint Replacement Right anterior hip     

## 2013-10-18 NOTE — Progress Notes (Signed)
Dr. Acey Lavarignan in- made aware of patient's blood pressures and I.V. Intake in the O.R.- Orders given- patient to receive 500 cc LR IVB  In PACU

## 2013-10-18 NOTE — Progress Notes (Signed)
Utilization review completed.  

## 2013-10-18 NOTE — Anesthesia Procedure Notes (Signed)
Spinal  Patient location during procedure: OR End time: 10/18/2013 7:18 AM Staffing CRNA/Resident: Noralyn Pick Performed by: anesthesiologist and resident/CRNA  Preanesthetic Checklist Completed: patient identified, site marked, surgical consent, pre-op evaluation, timeout performed, IV checked, risks and benefits discussed and monitors and equipment checked Spinal Block Patient position: sitting Prep: Betadine Patient monitoring: heart rate, continuous pulse ox and blood pressure Approach: midline Location: L2-3 Injection technique: single-shot Needle Needle type: Sprotte and Pencil-Tip  Needle gauge: 24 G Needle length: 9 cm Assessment Sensory level: T6 Additional Notes Expiration date of kit checked and confirmed. Patient tolerated procedure well, without complications.

## 2013-10-18 NOTE — Evaluation (Signed)
Physical Therapy Evaluation Patient Details Name: Norval Mortoneter A Golightly MRN: [id number] DOB: Oct 04, 1945 Today's Date: 10/18/2013   History of Present Illness  RDATHA 6/23  Clinical Impression  Pt tolerated well, more comfortable after getting up. Pt will benefit from PT to address problems listed.     Follow Up Recommendations Home health PT;Supervision/Assistance - 24 hour    Equipment Recommendations  Rolling walker with 5" wheels;3in1 (PT)    Recommendations for Other Services       Precautions / Restrictions Precautions Precautions: Fall      Mobility  Bed Mobility Overal bed mobility: Needs Assistance Bed Mobility: Supine to Sit     Supine to sit: Min assist     General bed mobility comments: use of sheet to move RLE to edge, HOB raised, cues on technique.  Transfers Overall transfer level: Needs assistance Equipment used: Rolling walker (2 wheeled) Transfers: Sit to/from Stand Sit to Stand: Min assist         General transfer comment: cues for sequence  and technique  Ambulation/Gait Ambulation/Gait assistance: Min assist Ambulation Distance (Feet): 50 Feet Assistive device: Rolling walker (2 wheeled) Gait Pattern/deviations: Step-to pattern;Antalgic     General Gait Details: cues for sequence  Stairs            Wheelchair Mobility    Modified Rankin (Stroke Patients Only)       Balance                                             Pertinent Vitals/Pain < 3 after getting OOB    Home Living Family/patient expects to be discharged to:: Private residence Living Arrangements: Spouse/significant other Available Help at Discharge: Family Type of Home: Apartment Home Access: Stairs to enter Entrance Stairs-Rails: Left Entrance Stairs-Number of Steps: 13 Home Layout: One level Home Equipment: None      Prior Function Level of Independence: Independent               Hand Dominance        Extremity/Trunk  Assessment   Upper Extremity Assessment: Overall WFL for tasks assessed           Lower Extremity Assessment: RLE deficits/detail RLE Deficits / Details: pt is able to  flex hip in supine       Communication   Communication: No difficulties  Cognition Arousal/Alertness: Awake/alert Behavior During Therapy: WFL for tasks assessed/performed Overall Cognitive Status: Within Functional Limits for tasks assessed                      General Comments      Exercises Total Joint Exercises Heel Slides: AAROM;Right;10 reps;Supine Hip ABduction/ADduction: AAROM;Right;10 reps      Assessment/Plan    PT Assessment Patient needs continued PT services  PT Diagnosis Difficulty walking;Acute pain   PT Problem List Decreased strength;Decreased range of motion;Decreased activity tolerance;Decreased safety awareness;Decreased knowledge of precautions;Decreased knowledge of use of DME;Pain  PT Treatment Interventions DME instruction;Gait training;Stair training;Functional mobility training;Therapeutic activities;Therapeutic exercise;Patient/family education   PT Goals (Current goals can be found in the Care Plan section) Acute Rehab PT Goals Patient Stated Goal: to get up PT Goal Formulation: With patient Time For Goal Achievement: 10/20/13 Potential to Achieve Goals: Good    Frequency 7X/week   Barriers to discharge        Co-evaluation  End of Session   Activity Tolerance: Patient tolerated treatment well Patient left: in chair;with call bell/phone within reach Nurse Communication: Mobility status         Time: 1500-1536 PT Time Calculation (min): 36 min   Charges:   PT Evaluation $Initial PT Evaluation Tier I: 1 Procedure PT Treatments $Gait Training: 8-22 mins $Therapeutic Exercise: 8-22 mins   PT G Codes:          Rada HayHill, Karen Elizabeth 10/18/2013, 3:47 PM

## 2013-10-18 NOTE — Interval H&P Note (Signed)
History and Physical Interval Note:  10/18/2013 6:27 AM  Clifford Garcia  has presented today for surgery, with the diagnosis of RIGHT HIP OA  The various methods of treatment have been discussed with the patient and family. After consideration of risks, benefits and other options for treatment, the patient has consented to  Procedure(s): RIGHT TOTAL HIP ARTHROPLASTY ANTERIOR APPROACH (Right) as a surgical intervention .  The patient's history has been reviewed, patient examined, no change in status, stable for surgery.  I have reviewed the patient's chart and labs.  Questions were answered to the patient's satisfaction.     Shelda PalLIN,MATTHEW D

## 2013-10-18 NOTE — Anesthesia Preprocedure Evaluation (Addendum)
Anesthesia Evaluation  Patient identified by MRN, date of birth, ID band Patient awake    Reviewed: Allergy & Precautions, H&P , NPO status , Patient's Chart, lab work & pertinent test results  Airway Mallampati: II TM Distance: >3 FB Neck ROM: Full    Dental no notable dental hx.    Pulmonary neg pulmonary ROS, former smoker,  breath sounds clear to auscultation  Pulmonary exam normal       Cardiovascular negative cardio ROS  Rhythm:Regular Rate:Normal     Neuro/Psych negative neurological ROS  negative psych ROS   GI/Hepatic negative GI ROS, Neg liver ROS,   Endo/Other  negative endocrine ROS  Renal/GU negative Renal ROS  negative genitourinary   Musculoskeletal negative musculoskeletal ROS (+)   Abdominal   Peds negative pediatric ROS (+)  Hematology negative hematology ROS (+)   Anesthesia Other Findings   Reproductive/Obstetrics negative OB ROS                           Anesthesia Physical Anesthesia Plan  ASA: II  Anesthesia Plan: Spinal   Post-op Pain Management:    Induction:   Airway Management Planned: Simple Face Mask  Additional Equipment:   Intra-op Plan:   Post-operative Plan:   Informed Consent: I have reviewed the patients History and Physical, chart, labs and discussed the procedure including the risks, benefits and alternatives for the proposed anesthesia with the patient or authorized representative who has indicated his/her understanding and acceptance.   Dental advisory given  Plan Discussed with: CRNA  Anesthesia Plan Comments:         Anesthesia Quick Evaluation  

## 2013-10-18 NOTE — Progress Notes (Signed)
X-ray results noted 

## 2013-10-19 DIAGNOSIS — D500 Iron deficiency anemia secondary to blood loss (chronic): Secondary | ICD-10-CM | POA: Diagnosis not present

## 2013-10-19 DIAGNOSIS — E663 Overweight: Secondary | ICD-10-CM | POA: Diagnosis present

## 2013-10-19 LAB — CBC
HCT: 34 % — ABNORMAL LOW (ref 39.0–52.0)
Hemoglobin: 11.9 g/dL — ABNORMAL LOW (ref 13.0–17.0)
MCH: 30.7 pg (ref 26.0–34.0)
MCHC: 35 g/dL (ref 30.0–36.0)
MCV: 87.6 fL (ref 78.0–100.0)
PLATELETS: 136 10*3/uL — AB (ref 150–400)
RBC: 3.88 MIL/uL — ABNORMAL LOW (ref 4.22–5.81)
RDW: 12.8 % (ref 11.5–15.5)
WBC: 10 10*3/uL (ref 4.0–10.5)

## 2013-10-19 LAB — BASIC METABOLIC PANEL
BUN: 13 mg/dL (ref 6–23)
CALCIUM: 8.2 mg/dL — AB (ref 8.4–10.5)
CO2: 26 mEq/L (ref 19–32)
Chloride: 103 mEq/L (ref 96–112)
Creatinine, Ser: 0.99 mg/dL (ref 0.50–1.35)
GFR calc Af Amer: >90 mL/min (ref >90)
GFR calc non Af Amer: 81 mL/min — ABNORMAL LOW (ref >90)
Glucose, Bld: 117 mg/dL — ABNORMAL HIGH (ref 70–99)
Potassium: 4.3 mEq/L (ref 3.7–5.3)
SODIUM: 137 meq/L (ref 137–147)

## 2013-10-19 MED ORDER — METHOCARBAMOL 500 MG PO TABS: 500.0000 mg | ORAL_TABLET | Freq: Four times a day (QID) | ORAL | Status: DC | PRN

## 2013-10-19 MED ORDER — FERROUS SULFATE 325 (65 FE) MG PO TABS: 325.0000 mg | ORAL_TABLET | Freq: Three times a day (TID) | ORAL | Status: DC

## 2013-10-19 MED ORDER — DSS 100 MG PO CAPS: 100.0000 mg | ORAL_CAPSULE | Freq: Two times a day (BID) | ORAL | Status: DC

## 2013-10-19 MED ORDER — HYDROCODONE-ACETAMINOPHEN 7.5-325 MG PO TABS: 1.0000 | ORAL_TABLET | ORAL | Status: DC

## 2013-10-19 MED ORDER — ASPIRIN 325 MG PO TBEC: 325.0000 mg | DELAYED_RELEASE_TABLET | Freq: Two times a day (BID) | ORAL | Status: AC

## 2013-10-19 MED ORDER — POLYETHYLENE GLYCOL 3350 17 G PO PACK: 17.0000 g | PACK | Freq: Two times a day (BID) | ORAL | Status: DC

## 2013-10-19 NOTE — Progress Notes (Signed)
Physical Therapy Treatment Patient Details Name: Clifford Garcia MRN: [id number] DOB: Oct 12, 1945 Today's Date: 10/19/2013    History of Present Illness RDATHA 6/23    PT Comments    Pt's gait is unsteady, frequent cues for safe use of RW. R LE buckled slightly sveral times. Pt has 13 steps to get to apt. Recommend delay DC until  AM to improve in strengthand safety.   Follow Up Recommendations  Home health PT;Supervision/Assistance - 24 hour     Equipment Recommendations  Rolling walker with 5" wheels;3in1 (PT);Crutches    Recommendations for Other Services       Precautions / Restrictions Precautions Precautions: Fall    Mobility  Bed Mobility Overal bed mobility: Needs Assistance Bed Mobility: Supine to Sit;Sit to Supine     Supine to sit: Min assist Sit to supine: Min assist   General bed mobility comments: assistance to get R leg onto bed, pt did use leg lifter  Transfers Overall transfer level: Needs assistance Equipment used: Rolling walker (2 wheeled) Transfers: Sit to/from Stand Sit to Stand: Min guard         General transfer comment: verbal cues for hand placement and LE management.  Ambulation/Gait Ambulation/Gait assistance: Min assist Ambulation Distance (Feet): 400 Feet Assistive device: Rolling walker (2 wheeled) Gait Pattern/deviations: Step-to pattern;Step-through pattern;Trunk flexed;Decreased stance time - right     General Gait Details: R leg buckled several times, cues for safety and position inside of RW, Pt tends to be behind RW and not using UE maximally for support. stopped pt several times to reposotion insde Rw. During turns pt tends to be out of RW. Concern for safety with RW this session if DC home today.   Stairs Stairs: Yes Stairs assistance: Mod assist Stair Management: Forwards;With crutches;One rail Left Number of Stairs: 4 General stair comments: caregiver present for instruction, cues for safety and  sequence.  Wheelchair Mobility    Modified Rankin (Stroke Patients Only)       Balance Overall balance assessment: Needs assistance         Standing balance support: Bilateral upper extremity supported;During functional activity Standing balance-Leahy Scale: Fair                      Cognition Arousal/Alertness: Awake/alert Behavior During Therapy: WFL for tasks assessed/performed Overall Cognitive Status: Within Functional Limits for tasks assessed                      Exercises Total Joint Exercises Quad Sets: AROM;Right;10 reps;Supine Short Arc Quad: AROM;Right;10 reps;Supine Heel Slides: AAROM;Right;10 reps;Supine Hip ABduction/ADduction: AAROM;Right;10 reps Long Arc Quad: AROM;Right;10 reps;Supine    General Comments        Pertinent Vitals/Pain R thigh 5, ice applied, premed.    Home Living                      Prior Function            PT Goals (current goals can now be found in the care plan section) Progress towards PT goals: Progressing toward goals    Frequency  7X/week    PT Plan Current plan remains appropriate    Co-evaluation             End of Session Equipment Utilized During Treatment: Gait belt Activity Tolerance: Patient tolerated treatment well Patient left: in bed;with call bell/phone within reach;with family/visitor present     Time: [id number] PT Time Calculation (  min): 27 min  Charges:  $Gait Training: 23-37 mins $Therapeutic Exercise: 8-22 mins                    G Codes:      Rada HayHill, Karen Elizabeth 10/19/2013, 5:30 PM

## 2013-10-19 NOTE — Progress Notes (Signed)
[facility]   [facility] is providing the [facility] services: RW and Commode  If patient discharges after hours, please call [phone].   Renard HamperLecretia Williamson 10/19/2013, 8:39 AM

## 2013-10-19 NOTE — Progress Notes (Signed)
   Subjective: 1 Day Post-Op Procedure(s) (LRB): RIGHT TOTAL HIP ARTHROPLASTY ANTERIOR APPROACH (Right)   Patient reports pain as mild, pain controlled. No events throughout the night. Ready to be discharged home.  Objective:   VITALS:   Filed Vitals:   10/19/13 0608  BP: 100/63  Pulse: 88  Temp: 98.2 F (36.8 C)  Resp: 16    Neurovascular intact Dorsiflexion/Plantar flexion intact Incision: dressing C/D/I No cellulitis present Compartment soft  LABS  Recent Labs  10/19/13 0500  HGB 11.9*  HCT 34.0*  WBC 10.0  PLT 136*     Recent Labs  10/19/13 0500  NA 137  K 4.3  BUN 13  CREATININE 0.99  GLUCOSE 117*     Assessment/Plan: 1 Day Post-Op Procedure(s) (LRB): RIGHT TOTAL HIP ARTHROPLASTY ANTERIOR APPROACH (Right) Foley cath d/c'ed  Advance diet Up with therapy D/C IV fluids Discharge home with home health [facility] Follow up with OLIN,Brittania Sudbeck D in 2 weeks.  Contact information:  [facility] [address], [address] [place] [id number] [phone]    Expected ABLA  Treated with iron and will observe  Overweight (BMI 25-29.9) Estimated body mass index is 26.83 kg/(m^2) as calculated from the following:   Height as of this encounter: 5\' 10"  (1.778 m).   Weight as of this encounter: 84.823 kg (187 lb). Patient also counseled that weight may inhibit the healing process Patient counseled that losing weight will help with future health issues         Anastasio AuerbachMatthew S. Jasdeep Dejarnett   PAC  10/19/2013, 8:04 AM

## 2013-10-19 NOTE — Anesthesia Postprocedure Evaluation (Signed)
  Anesthesia Post-op Note  Patient: Clifford MortonPeter A Garcia  Procedure(s) Performed: Procedure(s) (LRB): RIGHT TOTAL HIP ARTHROPLASTY ANTERIOR APPROACH (Right)  Patient Location: PACU  Anesthesia Type: Spinal  Level of Consciousness: awake and alert   Airway and Oxygen Therapy: Patient Spontanous Breathing  Post-op Pain: mild  Post-op Assessment: Post-op Vital signs reviewed, Patient's Cardiovascular Status Stable, Respiratory Function Stable, Patent Airway and No signs of Nausea or vomiting  Last Vitals:  Filed Vitals:   10/19/13 0608  BP: 100/63  Pulse: 88  Temp: 36.8 C  Resp: 16    Post-op Vital Signs: stable   Complications: No apparent anesthesia complications

## 2013-10-19 NOTE — Evaluation (Signed)
Occupational Therapy Evaluation Patient Details Name: Norval Mortoneter A Wattley MRN: [id number] DOB: [birth year] Today's Date: 10/19/2013    History of Present Illness RDATHA 6/23   Clinical Impression   Pt had just received pain meds prior to OT coming but willing to go ahead and get up with OT. Pt with increased pain with activity and some nausea during session but improved at end of session with rest and nausea resolved. Practiced 3in1 transfers and pt has assist with LB self care at home. Will see while on acute to progress ADL independence.     Follow Up Recommendations  No OT follow up;Supervision/Assistance - 24 hour    Equipment Recommendations  3 in 1 bedside comode (in room)    Recommendations for Other Services       Precautions / Restrictions Precautions Precautions: Fall Restrictions Weight Bearing Restrictions: No      Mobility Bed Mobility Overal bed mobility: Needs Assistance Bed Mobility: Supine to Sit     Supine to sit: Min guard     General bed mobility comments: although painful but able to move LEs over to EOb himself.  Transfers Overall transfer level: Needs assistance Equipment used: Rolling walker (2 wheeled) Transfers: Sit to/from Stand Sit to Stand: Min assist         General transfer comment: verbal cues for hand placement and LE management.    Balance                                            ADL Overall ADL's : Needs assistance/impaired Eating/Feeding: Independent;Sitting   Grooming: Wash/dry hands;Set up;Sitting   Upper Body Bathing: Set up;Sitting   Lower Body Bathing: Moderate assistance;Sit to/from stand   Upper Body Dressing : Set up;Sitting   Lower Body Dressing: Moderate assistance;Sit to/from stand   Toilet Transfer: Minimal assistance;Ambulation;RW;BSC   Toileting- Clothing Manipulation and Hygiene: Minimal assistance;Sit to/from stand         General ADL Comments: Pt states he will sponge bathe  initially as he has to step over into a tub. Discussed tub transfer bench option and pt states he is ok to sponge for a few days. Also discussed a tubseat if he feel he needs one once he gets home. Discussed sequence for stepping into tub. Pt states his significant other can assist with LB dressing at d/c. Pt had just received pain meds shortly before OT arrived so pain level was 7/10 starting and increased with activity and pt became a little nauseous. Pain improved to 4/10 once rested in recliner. Pt states he likely sat up too long in chair yesterday. Discussed importance of alternating activity with rest and not sitting for prolonged periods of time. Discussed sequence for LB dressing and safety with walker even if he will have assist with LB dressing. Pt tends to move quickly and needs cues to slow down and for hand placement and LE management.     Vision                     Perception     Praxis      Pertinent Vitals/Pain 7/10 at rest; increased to at least 8/10 with activity; down to 4/10 with rest.; reposition, ice. Had just had pain meds prior to OT     Hand Dominance     Extremity/Trunk Assessment Upper Extremity Assessment Upper Extremity Assessment: Overall  WFL for tasks assessed           Communication Communication Communication: No difficulties   Cognition Arousal/Alertness: Awake/alert Behavior During Therapy: WFL for tasks assessed/performed Overall Cognitive Status: Within Functional Limits for tasks assessed                     General Comments       Exercises       Shoulder Instructions      Home Living Family/patient expects to be discharged to:: Private residence Living Arrangements: Spouse/significant other Available Help at Discharge: Family Type of Home: Apartment Home Access: Stairs to enter Secretary/administratorntrance Stairs-Number of Steps: 13 Entrance Stairs-Rails: Left Home Layout: One level               Home Equipment: None           Prior Functioning/Environment Level of Independence: Independent             OT Diagnosis: Generalized weakness   OT Problem List: Decreased strength;Decreased knowledge of use of DME or AE   OT Treatment/Interventions: Self-care/ADL training;Patient/family education;Therapeutic activities;DME and/or AE instruction    OT Goals(Current goals can be found in the care plan section) Acute Rehab OT Goals Patient Stated Goal: decrease pain OT Goal Formulation: With patient Time For Goal Achievement: 10/26/13 Potential to Achieve Goals: Good  OT Frequency: Min 2X/week   Barriers to D/C:            Co-evaluation              End of Session Equipment Utilized During Treatment: Gait belt;Rolling walker  Activity Tolerance: Patient limited by pain Patient left: in chair;with call bell/phone within reach   Time: 0845-0915 OT Time Calculation (min): 30 min Charges:  OT General Charges $OT Visit: 1 Procedure OT Evaluation $Initial OT Evaluation Tier I: 1 Procedure OT Treatments $Self Care/Home Management : 8-22 mins $Therapeutic Activity: 8-22 mins G-Codes:    Lennox LaityStone, Stephanie Stafford [phone] 10/19/2013, 9:36 AM

## 2013-10-19 NOTE — Progress Notes (Signed)
Physical Therapy Treatment Patient Details Name: Clifford Garcia MRN: [id number] DOB: Aug 09, 1945 Today's Date: 10/19/2013    History of Present Illness RDATHA 6/23    PT Comments    Decreased control of R leg during swing initially. Instructed pt to really pay attention to the  Swing and  atempt to ER R leg to prevent  R foot from moving infront of L foot. Pt practiced steps with sig other present. Will see for second session prior to DC.  Follow Up Recommendations  Home health PT;Supervision/Assistance - 24 hour     Equipment Recommendations  Rolling walker with 5" wheels;3in1 (PT);Crutches    Recommendations for Other Services       Precautions / Restrictions Precautions Precautions: Fall    Mobility  Bed Mobility Overal bed mobility: Needs Assistance Bed Mobility: Sit to Supine       Sit to supine: Min assist   General bed mobility comments: assistance to get R leg onto bed, pt did use leg lifter  Transfers Overall transfer level: Needs assistance Equipment used: Rolling walker (2 wheeled) Transfers: Sit to/from Stand Sit to Stand: Min guard         General transfer comment: verbal cues for hand placement and LE management.  Ambulation/Gait Ambulation/Gait assistance: Min assist Ambulation Distance (Feet): 250 Feet   Gait Pattern/deviations: Step-to pattern;Step-through pattern;Decreased step length - right;Decreased stance time - right     General Gait Details: R leg tends to IR and adduct during swing initially. as Pt walked more, swing improved, cues for safety during ambulation due to decreased R leg control.   Stairs Stairs: Yes Stairs assistance: Mod assist Stair Management: Forwards;With crutches;One rail Left Number of Stairs: 4 General stair comments: caregiver present for instruction, cues for safety and sequence.  Wheelchair Mobility    Modified Rankin (Stroke Patients Only)       Balance Overall balance assessment: Needs  assistance         Standing balance support: Bilateral upper extremity supported;During functional activity Standing balance-Leahy Scale: Fair                      Cognition Arousal/Alertness: Chartered certified accountantAwake/alert                          Exercises Total Joint Exercises Quad Sets: AROM;Right;10 reps;Supine Short Arc Quad: AROM;Right;10 reps;Supine Heel Slides: AAROM;Right;10 reps;Supine Hip ABduction/ADduction: AAROM;Right;10 reps Long Arc Quad: AROM;Right;10 reps;Supine    General Comments        Pertinent Vitals/Pain R thigh 4, ice applied.    Home Living                      Prior Function            PT Goals (current goals can now be found in the care plan section) Progress towards PT goals: Progressing toward goals    Frequency  7X/week    PT Plan Current plan remains appropriate    Co-evaluation             End of Session Equipment Utilized During Treatment: Gait belt Activity Tolerance: Patient tolerated treatment well Patient left: in bed;with call bell/phone within reach;with family/visitor present     Time: [id number] PT Time Calculation (min): 39 min  Charges:  $Gait Training: 23-37 mins $Therapeutic Exercise: 8-22 mins  G Codes:      Rada HayHill, Karen Elizabeth 10/19/2013, 2:51 PM Blanchard KelchKaren Hill PT [phone]

## 2013-10-20 LAB — CBC
HCT: 35.1 % — ABNORMAL LOW (ref 39.0–52.0)
Hemoglobin: 12.1 g/dL — ABNORMAL LOW (ref 13.0–17.0)
MCH: 30.7 pg (ref 26.0–34.0)
MCHC: 34.5 g/dL (ref 30.0–36.0)
MCV: 89.1 fL (ref 78.0–100.0)
Platelets: 138 10*3/uL — ABNORMAL LOW (ref 150–400)
RBC: 3.94 MIL/uL — ABNORMAL LOW (ref 4.22–5.81)
RDW: 12.8 % (ref 11.5–15.5)
WBC: 8.9 10*3/uL (ref 4.0–10.5)

## 2013-10-20 LAB — BASIC METABOLIC PANEL
BUN: 15 mg/dL (ref 6–23)
CO2: 27 mEq/L (ref 19–32)
Calcium: 8.5 mg/dL (ref 8.4–10.5)
Chloride: 99 mEq/L (ref 96–112)
Creatinine, Ser: 1.09 mg/dL (ref 0.50–1.35)
GFR calc Af Amer: 77 mL/min — ABNORMAL LOW (ref >90)
GFR, EST NON AFRICAN AMERICAN: 67 mL/min — AB (ref >90)
Glucose, Bld: 102 mg/dL — ABNORMAL HIGH (ref 70–99)
POTASSIUM: 4.3 meq/L (ref 3.7–5.3)
SODIUM: 137 meq/L (ref 137–147)

## 2013-10-20 NOTE — Discharge Summary (Signed)
Physician Discharge Summary  Patient ID: Clifford Garcia MRN: [id number] DOB/AGE: [birth year] 68 y.o.  Admit date: 10/18/2013 Discharge date: 10/20/2013   Procedures:  Procedure(s) (LRB): RIGHT TOTAL HIP ARTHROPLASTY ANTERIOR APPROACH (Right)  Attending Physician:  Dr. Durene RomansMatthew Olin   Admission Diagnoses:   Right hip OA / pain  Discharge Diagnoses:  Principal Problem:   S/P right THA, AA Active Problems:   Expected blood loss anemia   Overweight (BMI 25.0-29.9)  Past Medical History  Diagnosis Date  . Environmental allergies   . Arthritis   . History of gout     X 1 EPISODE AGE 75  . Eczema   . History of kidney stones     HPI: Clifford Garcia, 68 y.o. male, has a history of pain and functional disability in the right hip(s) due to arthritis and patient has failed non-surgical conservative treatments for greater than 12 weeks to include NSAID's and/or analgesics and activity modification. Onset of symptoms was gradual starting >10 years ago with gradually worsening course since that time.The patient noted no past surgery on the right hip(s). Patient currently rates pain in the right hip at 7 out of 10 with activity. Patient has night pain, worsening of pain with activity and weight bearing, trendelenberg gait, pain that interfers with activities of daily living and pain with passive range of motion. Patient has evidence of periarticular osteophytes and joint space narrowing by imaging studies. This condition presents safety issues increasing the risk of falls. There is no current active infection. Risks, benefits and expectations were discussed with the patient. Risks including but not limited to the risk of anesthesia, blood clots, nerve damage, blood vessel damage, failure of the prosthesis, infection and up to and including death. Patient understand the risks, benefits and expectations and wishes to proceed with surgery.   PCP: No PCP Per Patient   Discharged Condition:  good  Hospital Course:  Patient underwent the above stated procedure on 10/18/2013. Patient tolerated the procedure well and brought to the recovery room in good condition and subsequently to the floor.  POD #1 BP: 100/63 ; Pulse: 88 ; Temp: 98.2 F (36.8 C) ; Resp: 16 Patient reports pain as mild, pain controlled. No events throughout the night. Neurovascular intact, dorsiflexion/plantar flexion intact, incision: dressing C/D/I, no cellulitis present and compartment soft.   LABS  Basename    HGB  11.9  HCT  34.0   POD #2  BP: 110/67 ; Pulse: 95 ; Temp: 98.5 F (36.9 C) ; Resp: 16 Patient reports pain as mild, pain controlled. No events throughout the night. Ready to be discharged home. Neurovascular intact, dorsiflexion/plantar flexion intact, incision: dressing C/D/I, no cellulitis present and compartment soft.   LABS  Basename    HGB  12.1  HCT  35.1    Discharge Exam: General appearance: alert, cooperative and no distress Extremities: Homans sign is negative, no sign of DVT, no edema, redness or tenderness in the calves or thighs and no ulcers, gangrene or trophic changes  Disposition: Home with follow up in 2 weeks   Follow-up Information   Follow up with Shelda PalLIN,MATTHEW D, MD. Schedule an appointment as soon as possible for a visit in 2 weeks.   Specialty:  Orthopedic Surgery   Contact information:   [address] [place] [id number] [phone]       Discharge Instructions   Call MD / Call 911    Complete by:  As directed   If you  experience chest pain or shortness of breath, CALL 911 and be transported to the hospital emergency room.  If you develope a fever above 101 F, pus (white drainage) or increased drainage or redness at the wound, or calf pain, call your surgeon's office.     Change dressing    Complete by:  As directed   Maintain surgical dressing for 10-14 days, or until follow up in the clinic.     Constipation Prevention    Complete  by:  As directed   Drink plenty of fluids.  Prune juice may be helpful.  You may use a stool softener, such as Colace (over the counter) 100 mg twice a day.  Use MiraLax (over the counter) for constipation as needed.     Diet - low sodium heart healthy    Complete by:  As directed      Discharge instructions    Complete by:  As directed   Maintain surgical dressing for 10-14 days, or until follow up in the clinic. [facility]. Call with any questions or concerns.     Increase activity slowly as tolerated    Complete by:  As directed      TED hose    Complete by:  As directed   Use stockings (TED hose) for 2 weeks on both leg(s).  You may remove them at night for sleeping.     Weight bearing as tolerated    Complete by:  As directed   Laterality:  right  Extremity:  Lower             Medication List    STOP taking these medications       ibuprofen 200 MG tablet  Commonly known as:  ADVIL,MOTRIN      TAKE these medications       aspirin 325 MG EC tablet  Take 1 tablet (325 mg total) by mouth 2 (two) times daily.     desonide 0.05 % cream  Commonly known as:  DESOWEN  Apply 1 application topically daily. Applied to nose     DSS 100 MG Caps  Take 100 mg by mouth 2 (two) times daily.     ferrous sulfate 325 (65 FE) MG tablet  Take 1 tablet (325 mg total) by mouth 3 (three) times daily after meals.     HYDROcodone-acetaminophen 7.5-325 MG per tablet  Commonly known as:  NORCO  Take 1-2 tablets by mouth every 4 (four) hours.     methocarbamol 500 MG tablet  Commonly known as:  ROBAXIN  Take 1 tablet (500 mg total) by mouth every 6 (six) hours as needed for muscle spasms.     METRONIDAZOLE (TOPICAL) 0.75 % Lotn  Apply 1 application topically daily. Applied to nose     nystatin cream  Commonly known as:  MYCOSTATIN  Apply 1 application topically daily. Applied to nose     polyethylene glycol packet  Commonly known as:  MIRALAX /  GLYCOLAX  Take 17 g by mouth 2 (two) times daily.     tobramycin-dexamethasone ophthalmic solution  Commonly known as:  TOBRADEX  Place 1 drop into both eyes every morning.         Signed: Anastasio AuerbachMatthew S. Babish   PA-C  10/20/2013, 2:17 PM

## 2013-10-20 NOTE — Progress Notes (Signed)
Pt is refusing scheduled Celebrex states he is too worried about any possible side effects.Linward HeadlandBeverly, Cynthia D

## 2013-10-20 NOTE — Progress Notes (Signed)
Physical Therapy Treatment Patient Details Name: Clifford Garcia A Kendall MRN: [id number] DOB: 09-Mar-1946 Today's Date: 10/20/2013    History of Present Illness RDATHA 6/23    PT Comments    Gait is much safer, smoother today. Practiced stairs again. Pt is ready for DC.  Follow Up Recommendations  Home health PT;Supervision/Assistance - 24 hour     Equipment Recommendations  Rolling walker with 5" wheels;3in1 (PT);Crutches    Recommendations for Other Services       Precautions / Restrictions Restrictions Weight Bearing Restrictions: No    Mobility  Bed Mobility   Bed Mobility: Supine to Sit     Supine to sit: Modified independent (Device/Increase time)     General bed mobility comments: no assist needed nor leg lifter.  Transfers Overall transfer level: Needs assistance Equipment used: Rolling walker (2 wheeled)   Sit to Stand: Supervision         General transfer comment: verbal cues for hand placement and LE management.  Ambulation/Gait Ambulation/Gait assistance: Min guard Ambulation Distance (Feet): 150 Feet Assistive device: Rolling walker (2 wheeled) Gait Pattern/deviations: Step-through pattern     General Gait Details: cues for safe turning, keeping inside of RW,pt c/o dizziness and returned to room. pt reports he has not eaten today. BP 134/74   Stairs   Stairs assistance: Min assist Stair Management: One rail Left;Step to pattern;With crutches Number of Stairs: 5 General stair comments: cues for sequence.  Wheelchair Mobility    Modified Rankin (Stroke Patients Only)       Balance                                    Cognition Arousal/Alertness: Awake/alert                          Exercises Total Joint Exercises Quad Sets: AROM;Right;10 reps;Supine Short Arc Quad: AROM;Right;10 reps;Supine Heel Slides: Right;10 reps;Supine;AROM Hip ABduction/ADduction: Right;10 reps;AROM    General Comments         Pertinent Vitals/Pain R thigh is sore.    Home Living                      Prior Function            PT Goals (current goals can now be found in the care plan section) Progress towards PT goals: Progressing toward goals    Frequency       PT Plan Current plan remains appropriate    Co-evaluation             End of Session   Activity Tolerance: Patient tolerated treatment well Patient left: in chair;with call bell/phone within reach     Time: 0930-1030 PT Time Calculation (min): 60 min  Charges:  $Gait Training: 8-22 mins $Therapeutic Exercise: 8-22 mins $Self Care/Home Management: 8-22                    G Codes:      Rada HayHill, Karen Elizabeth 10/20/2013, 10:44 AM

## 2013-10-20 NOTE — Progress Notes (Signed)
   Subjective: 2 Days Post-Op Procedure(s) (LRB): RIGHT TOTAL HIP ARTHROPLASTY ANTERIOR APPROACH (Right)   Patient reports pain as mild, pain controlled. No events throughout the night. Ready to be discharged home.  Objective:   VITALS:   Filed Vitals:   10/20/13 0554  BP: 110/67  Pulse: 95  Temp: 98.5 F (36.9 C)  Resp: 16    Neurovascular intact Dorsiflexion/Plantar flexion intact Incision: dressing C/D/I No cellulitis present Compartment soft  LABS  Recent Labs  10/19/13 0500 10/20/13 0441  HGB 11.9* 12.1*  HCT 34.0* 35.1*  WBC 10.0 8.9  PLT 136* 138*     Recent Labs  10/19/13 0500 10/20/13 0441  NA 137 137  K 4.3 4.3  BUN 13 15  CREATININE 0.99 1.09  GLUCOSE 117* 102*     Assessment/Plan: 2 Days Post-Op Procedure(s) (LRB): RIGHT TOTAL HIP ARTHROPLASTY ANTERIOR APPROACH (Right) Up with therapy Discharge home with home health Follow [facility] weeks [facility]. Follow up with OLIN,Edye Hainline D in 2 weeks.  Contact information:  [facility] [address] [place] [id number] [phone]    Expected ABLA  Treated with iron and will observe   Overweight (BMI 25-29.9)  Estimated body mass index is 26.83 kg/(m^2) as calculated from the following:      Height as of this encounter: 5\' 10"  (1.778 m).      Weight as of this encounter: 84.823 kg (187 lb).  Patient also counseled that weight may inhibit the healing process  Patient counseled that losing weight will help with future health issues            Anastasio AuerbachMatthew S. Aara Jacquot   PAC  10/20/2013, 8:07 AM

## 2015-08-03 ENCOUNTER — Encounter: Payer: Self-pay | Admitting: Podiatry

## 2015-08-03 ENCOUNTER — Ambulatory Visit (INDEPENDENT_AMBULATORY_CARE_PROVIDER_SITE_OTHER): Payer: Medicare Other | Admitting: Podiatry

## 2015-08-03 VITALS — BP 116/70 | HR 74 | Resp 14

## 2015-08-03 DIAGNOSIS — M205X9 Other deformities of toe(s) (acquired), unspecified foot: Secondary | ICD-10-CM

## 2015-08-03 DIAGNOSIS — M2020 Hallux rigidus, unspecified foot: Secondary | ICD-10-CM

## 2015-08-03 DIAGNOSIS — M722 Plantar fascial fibromatosis: Secondary | ICD-10-CM

## 2015-08-03 NOTE — Progress Notes (Signed)
   Subjective:    Patient ID: Clifford MortonPeter A Garcia, male    DOB: 22-Jun-1945, 70 y.o.   MRN: [id number]  HPI this patient presents to the office stating that he is interested in receiving an additional pair of orthotics. [facility] and was treated for plantar fasciitis and hallux limitus with orthotic therapy. He says his feet are pain-free and he desires to have a new pair of orthotics made for him today The patient presents here today for new custom Orthotics.   Review of Systems  HENT: Positive for hearing loss.        Objective:   Physical Exam GENERAL APPEARANCE: Alert, conversant. Appropriately groomed. No acute distress.  VASCULAR: Pedal pulses are  palpable at  [facility] and [facility] bilateral.  Capillary refill time is immediate to all digits,  Normal temperature gradient.  Digital hair growth is present bilateral  NEUROLOGIC: sensation is normal to 5.07 monofilament at 5/5 sites bilateral.  Light touch is intact bilateral, Muscle strength normal.  MUSCULOSKELETAL: acceptable muscle strength, tone and stability bilateral.  Intrinsic muscluature intact bilateral.  Rectus appearance of foot and digits noted bilateral. Plantar fascitis B/L.  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  No preulcerative lesions or ulcers  are seen, no interdigital maceration noted.  No open lesions present.  Digital nails are asymptomatic. No drainage noted.        Assessment & Plan:  Plantar Fascitis  Hallux Limitus/HAV   IE  Scanning for orthoses. RTC when orthoses arrive.   Helane GuntherGregory Mayer DPM

## 2015-10-05 DIAGNOSIS — H903 Sensorineural hearing loss, bilateral: Secondary | ICD-10-CM | POA: Insufficient documentation

## 2017-10-12 DIAGNOSIS — R42 Dizziness and giddiness: Secondary | ICD-10-CM | POA: Insufficient documentation

## 2018-05-12 ENCOUNTER — Encounter: Payer: Self-pay | Admitting: Podiatry

## 2018-05-12 ENCOUNTER — Ambulatory Visit: Payer: Self-pay

## 2018-05-12 ENCOUNTER — Encounter

## 2018-05-12 ENCOUNTER — Ambulatory Visit: Payer: Medicare Other | Admitting: Podiatry

## 2018-05-12 DIAGNOSIS — L84 Corns and callosities: Secondary | ICD-10-CM

## 2018-05-12 DIAGNOSIS — M205X2 Other deformities of toe(s) (acquired), left foot: Secondary | ICD-10-CM

## 2018-05-12 DIAGNOSIS — M21619 Bunion of unspecified foot: Secondary | ICD-10-CM

## 2018-05-12 NOTE — Progress Notes (Signed)
This patient presents to the office with chief complaint of a painful callus right foot.  He says the callus is present on the inside border big toe right foot.  He says that the callus is intermittently painful which causes him and this causes him to trim the callus himself.  He says he is having no pain or discomfort associated with his bunion right foot.  He presents to the office to discuss his callus and bunion.  Vascular  Dorsalis pedis and posterior tibial pulses are palpable  B/L.  Capillary return  WNL.  Temperature gradient is  WNL.  Skin turgor  WNL  Sensorium  Senn Weinstein monofilament wire  WNL. Normal tactile sensation.  Nail Exam  Patient has normal nails with no evidence of bacterial or fungal infection.  Orthopedic  Exam  Muscle tone and muscle strength  WNL.  No limitations of motion feet  B/L.  No crepitus or joint effusion noted.  Foot type is unremarkable and digits show no abnormalities. 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  Skin  No open lesions.  Normal skin texture and turgor.  Pinch callus right hallux.  Pinch callus secondary bunion 1st MPJ  Right.  ROV.  Discussed that discussed this condition with this painDiscussed this condition with this patient.  Told him the callus forms due to bunion/fhl right foot.  Debride pinch callus.  Told him to use a pumice stone at home.  Told him to return to the office for orthoses when he is ready.   Helane Gunther DPM

## 2018-05-25 ENCOUNTER — Other Ambulatory Visit: Payer: Self-pay

## 2018-05-25 ENCOUNTER — Emergency Department (HOSPITAL_COMMUNITY)
Admission: EM | Admit: 2018-05-25 | Discharge: 2018-05-25 | Disposition: A | Discharge status: Home / Self Care | Payer: Medicare Other | Attending: Emergency Medicine | Admitting: Emergency Medicine

## 2018-05-25 ENCOUNTER — Encounter (HOSPITAL_COMMUNITY): Payer: Self-pay | Admitting: Obstetrics and Gynecology

## 2018-05-25 ENCOUNTER — Emergency Department (HOSPITAL_COMMUNITY): Payer: Medicare Other

## 2018-05-25 DIAGNOSIS — Y999 Unspecified external cause status: Secondary | ICD-10-CM | POA: Diagnosis not present

## 2018-05-25 DIAGNOSIS — Z96641 Presence of right artificial hip joint: Secondary | ICD-10-CM | POA: Diagnosis not present

## 2018-05-25 DIAGNOSIS — W1789XA Other fall from one level to another, initial encounter: Secondary | ICD-10-CM | POA: Diagnosis not present

## 2018-05-25 DIAGNOSIS — R0781 Pleurodynia: Secondary | ICD-10-CM

## 2018-05-25 DIAGNOSIS — Y929 Unspecified place or not applicable: Secondary | ICD-10-CM | POA: Insufficient documentation

## 2018-05-25 DIAGNOSIS — W19XXXA Unspecified fall, initial encounter: Secondary | ICD-10-CM

## 2018-05-25 DIAGNOSIS — Z87891 Personal history of nicotine dependence: Secondary | ICD-10-CM | POA: Insufficient documentation

## 2018-05-25 DIAGNOSIS — Y9375 Activity, martial arts: Secondary | ICD-10-CM | POA: Diagnosis not present

## 2018-05-25 MED ORDER — LIDOCAINE 5 % EX PTCH
1.0000 | MEDICATED_PATCH | CUTANEOUS | Status: DC
  Administered 2018-05-25: 1 via TRANSDERMAL
  Filled 2018-05-25: qty 1

## 2018-05-25 MED ORDER — LIDOCAINE 5 % EX PTCH: 1.0000 | MEDICATED_PATCH | CUTANEOUS | 0 refills | Status: DC

## 2018-05-25 NOTE — Discharge Instructions (Addendum)
You have been diagnosed today with musculoskeletal left rib pain after fall.   At this time there does not appear to be the presence of an emergent medical condition, however there is always the potential for conditions to change. Please read and follow the below instructions.  Please return to the Emergency Department immediately for any new or worsening symptoms. Please be sure to follow up with your Primary Care Provider within one week regarding your visit today; please call their office to schedule an appointment even if you are feeling better for a follow-up visit. You may use the Lidoderm patch as prescribed to help with your symptoms.  Rest, ice will also be helpful.  Please avoid further martial arts or contact for the next 3-4 weeks and follow-up with your primary care provider.  You may also use Tylenol as directed on the packaging to help with your symptoms. Please follow-up with your orthopedics regarding your right shoulder pain.  Get help right away if: You have nausea or vomiting. You feel sweaty, weak or light-headed. You have a cough with mucus from your lungs (sputum) or you cough up blood. You develop shortness of breath. You have a fever. Your chest pain becomes worse. You have new symptoms.  Please read the additional information packets attached to your discharge summary.  Do not take your medicine if  develop an itchy rash, swelling in your mouth or lips, or difficulty breathing.

## 2018-05-25 NOTE — ED Provider Notes (Signed)
[facility]-EMERGENCY DEPT Provider Note   CSN: [id number] Arrival date & time: 05/25/18  1114     History   Chief Complaint Chief Complaint  Patient presents with  . Fall    HPI Clifford Garcia is a 73 y.o. male presenting after fall on 05/23/2018 for left rib pain.  Patient reports that he is an otherwise healthy 73 year old male without chronic medical conditions or daily medication use aside from barrier cream for his nose.  Patient states that he was attending martial arts class on Sunday, 05/23/2018 when he lost his balance fell onto his right shoulder.  Patient states that at that time they were practicing with wooden sticks and he believes he was struck on the left ribs with his stick during the fall.  Patient denies head injury, loss of consciousness/syncope, neck or back pain.  Patient states that he had mild right shoulder and left rib pain after the fall.  He went to orthopedics office today, informed that he had arthritis and spurs of the right shoulder without acute injury but was encouraged to present to the ER for evaluation of his rib pain.  Patient describes his left rib pain as a mild throbbing pain that is made worse with light palpation of the area.  He denies chest pain, shortness of breath or pain with breathing. He denies abdominal pain or any bruising. Denies pelvic pain.  Patient states that his rib pain is improved by not touching the area or moving in certain directions.  Patient denies blood thinner use. HPI  Past Medical History:  Diagnosis Date  . Arthritis   . Eczema   . Environmental allergies   . History of gout    X 1 EPISODE AGE 77  . History of kidney stones     Patient Active Problem List   Diagnosis Date Noted  . Expected blood loss anemia 10/19/2013  . Overweight (BMI 25.0-29.9) 10/19/2013  . 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 CATARACTS REMOVED    . TOTAL HIP  ARTHROPLASTY Right 10/18/2013   Procedure: RIGHT TOTAL HIP ARTHROPLASTY ANTERIOR APPROACH;  Surgeon: Shelda PalMatthew D Olin, MD;  Location: [facility];  Service: Orthopedics;  Laterality: Right;       Home Medications    Prior to Admission medications   Medication Sig Start Date End Date Taking? Authorizing Provider  desonide (DESOWEN) 0.05 % cream  08/25/15   [provider]  lidocaine (LIDODERM) 5 % Place 1 patch onto the skin daily. Remove & Discard patch within 12 hours or as directed by MD 05/25/18   Bill SalinasMorelli, Hermenegildo Clausen A, PA-C    Family History No family history on file.  Social History Social History   Tobacco Use  . Smoking status: Former Smoker    Last attempt to quit: 10/13/1984    Years since quitting: 33.6  . Smokeless tobacco: Never Used  Substance Use Topics  . Alcohol use: Never    Frequency: Never    Comment: OCCASIONAL  . Drug use: Never     Allergies   Tetracycline hcl and Tetracyclines & related   Review of Systems Review of Systems  Constitutional: Negative.  Negative for chills and fever.  Eyes: Negative.  Negative for visual disturbance.  Respiratory: Negative.  Negative for cough and shortness of breath.   Cardiovascular: Negative.  Negative for chest pain and leg swelling.  Gastrointestinal: Negative.  Negative for abdominal pain, diarrhea, nausea  and vomiting.  Musculoskeletal: Positive for arthralgias and myalgias. Negative for back pain, neck pain and neck stiffness.  Skin: Negative.  Negative for wound.  Neurological: Negative.  Negative for dizziness, syncope, speech difficulty, weakness, numbness and headaches.  All other systems reviewed and are negative.  Physical Exam Updated Vital Signs BP 135/87 (BP Location: Right Arm)   Pulse 85   Temp 98 F (36.7 C) (Oral)   Resp 18   Ht 5\' 10"  (1.778 m)   Wt 81.6 kg   SpO2 97%   BMI 25.83 kg/m   Physical Exam Constitutional:      General: He is not in acute distress.    Appearance: Normal  appearance. He is well-developed. He is not ill-appearing or diaphoretic.  HENT:     Head: Normocephalic and atraumatic. No raccoon eyes, Battle's sign, abrasion or contusion.     Jaw: There is normal jaw occlusion.     Right Ear: Tympanic membrane, ear canal and external ear normal. No hemotympanum.     Left Ear: Tympanic membrane, ear canal and external ear normal. No hemotympanum.     Nose: Nose normal.     Mouth/Throat:     Lips: Pink.     Mouth: Mucous membranes are moist.     Pharynx: Oropharynx is clear. Uvula midline.  Eyes:     General: Vision grossly intact. Gaze aligned appropriately.     Extraocular Movements: Extraocular movements intact.     Conjunctiva/sclera: Conjunctivae normal.     Pupils: Pupils are equal, round, and reactive to light.  Neck:     Musculoskeletal: Full passive range of motion without pain, normal range of motion and neck supple. No spinous process tenderness or muscular tenderness.     Trachea: Trachea and phonation normal. No tracheal tenderness or tracheal deviation.  Cardiovascular:     Rate and Rhythm: Normal rate and regular rhythm.     Pulses: Normal pulses.          Dorsalis pedis pulses are 2+ on the right side and 2+ on the left side.       Posterior tibial pulses are 2+ on the right side and 2+ on the left side.     Heart sounds: Normal heart sounds.  Pulmonary:     Effort: Pulmonary effort is normal. No respiratory distress.     Breath sounds: Normal breath sounds and air entry. No decreased breath sounds.  Chest:     Chest wall: Tenderness present. No deformity or crepitus.       Comments: Patient tender to light palpation of the left lower ribs. Abdominal:     Palpations: Abdomen is soft.     Tenderness: There is no abdominal tenderness. There is no guarding or rebound.  Musculoskeletal: Normal range of motion.     Comments: No midline C/T/L spinal tenderness to palpation, no paraspinal muscle tenderness, no deformity, crepitus, or  step-off noted. No sign of injury to the neck or back.  Hips stable to compression bilaterally without pain.  Patient able to bring knees towards chest without pain.   Right Shoulder: Appearance normal. No obvious bony deformity. No skin swelling, erythema, heat, fluctuance or break of the skin. No clavicular deformity or TTP. TTP mild tenderness to palpitation of deltoid . Active and passive flexion, extension, abduction, adduction, and internal/external rotation intact with mild pain but without crepitus. Strength for flexion, extension, abduction, adduction, and internal/external rotation intact and appropriate for age.  Patient able to actively bring hands  above head to touching and behind back touching.  Right Elbow: Appearance normal. No obvious bony deformity. No skin swelling, erythema, heat, fluctuance or break of the skin. No TTP over joint. Active flexion, extension, supination and pronation full and intact without pain. Strength able and appropriate for age for flexion and extension.  Radial Pulse 2+. Cap refill <2 seconds. SILT for M/U/R distributions. Compartments soft.    Feet:     Right foot:     Protective Sensation: 3 sites tested. 3 sites sensed.     Left foot:     Protective Sensation: 3 sites tested. 3 sites sensed.  Skin:    General: Skin is warm and dry.     Capillary Refill: Capillary refill takes less than 2 seconds.     Findings: No abrasion or bruising.  Neurological:     General: No focal deficit present.     Mental Status: He is alert and oriented to person, place, and time.     GCS: GCS eye subscore is 4. GCS verbal subscore is 5. GCS motor subscore is 6.     Comments: Mental Status: Alert, oriented, thought content appropriate, able to give a coherent history. Speech fluent without evidence of aphasia. Able to follow 2 step commands without difficulty. Cranial Nerves: II: Peripheral visual fields grossly normal, pupils equal, round, reactive to  light III,IV, VI: ptosis not present, extra-ocular motions intact bilaterally V,VII: smile symmetric, eyebrows raise symmetric, facial light touch sensation equal VIII: hearing grossly normal to voice X: uvula elevates symmetrically XI: bilateral shoulder shrug symmetric and strong XII: midline tongue extension without fassiculations Motor: Normal tone. 5/5 strength in upper and lower extremities bilaterally including strong and equal grip strength and dorsiflexion/plantar flexion Sensory: Sensation intact to light touch in all extremities.Negative Romberg.  Cerebellar: normal finger-to-nose with bilateral upper extremities. Normal heel-to -shin balance bilaterally of the lower extremity. No pronator drift.  Gait: normal gait and balance CV: distal pulses palpable throughout  Psychiatric:        Behavior: Behavior normal.    ED Treatments / Results  Labs (all labs ordered are listed, but only abnormal results are displayed) Labs Reviewed - No data to display  EKG None  Radiology Dg Ribs Unilateral W/chest Left  Result Date: 05/25/2018 CLINICAL DATA:  Acute LEFT rib pain following fall 2 days ago. Initial encounter. EXAM: LEFT RIBS AND CHEST - 3+ VIEW COMPARISON:  None. FINDINGS: No fracture or other bone lesions are seen involving the ribs. There is no evidence of pneumothorax or pleural effusion. Both lungs are clear. Heart size and mediastinal contours are within normal limits. IMPRESSION: Negative. Electronically Signed   By: Harmon Pier M.D.   On: 05/25/2018 13:29    Procedures Procedures (including critical care time)  Medications Ordered in ED Medications  lidocaine (LIDODERM) 5 % 1 patch (has no administration in time range)     Initial Impression / Assessment and Plan / ED Course  I have reviewed the triage vital signs and the nursing notes.  Pertinent labs & imaging results that were available during my care of the patient were reviewed by me and considered in  my medical decision making (see chart for details).    73 year old otherwise healthy male presenting today after fall that occurred 2 days prior to arrival.  Patient practicing martial arts when he fell onto his right shoulder and apparently also striking his left ribs with the martial arts stick that he was using.  Denies head injury,  loss of consciousness, blood thinner use, vision changes, neck/back pain, numbness/tingling or weakness.  Patient's only other concern from his fall was right shoulder pain for which he has previously been evaluated and cleared by orthopedics specialist who have encouraged him to use rice therapy for the next 3-4 weeks.  Patient with full range of motion and 5/5 strength to bilateral upper extremities. Extremity neurovascularly intact; no signs of infection, septic joint, DVT, compartment syndrome.  Patient without signs of head, neck, or back injury; no midline spinal tenderness or tenderness to palpation of the abdomen. Normal neurological exam. No concern for closed head injury, lung injury, or intraabdominal injury.  Patient change into gown and is without signs of injury  As for patient's left rib pain, imaging today negative for fractures.  Patient is tender to light palpation of the musculature overlying the left ribs.  No bruising, deformity or obvious sign of injury.  Pain is consistently reproducible on examination.  Patient without chest pain or shortness of breath.  Suspect musculoskeletal etiology of pain doubt cardiopulmonary process today.  Doubt intrathoracic, intra-abdominal or retroperitoneal injury.  Patient is without shortness of breath or pain with breathing today however out of abundance of caution and due to patient's age nursing staff has discussed incentive spirometry with the patient.  Discussed rice protocol with patient, encourage primary care follow-up, return precautions discussed at length.  Patient informed that he may use Tylenol, avoid NSAID  use due to age.  Patient given Lidoderm patch here for his musculoskeletal soreness, prescription given.  At this time there does not appear to be any evidence of an acute emergency medical condition and the patient appears stable for discharge with appropriate outpatient follow up. Diagnosis was discussed with patient who verbalizes understanding of care plan and is agreeable to discharge. I have discussed return precautions with patient who verbalizes understanding of return precautions. Patient strongly encouraged to follow-up with their PCP within one week. All questions answered.  Patient's case discussed with Dr. Jacqulyn BathLong who agrees with plan to discharge with follow-up.   Note: Portions of this report may have been transcribed using voice recognition software. Every effort was made to ensure accuracy; however, inadvertent computerized transcription errors may still be present. Final Clinical Impressions(s) / ED Diagnoses   Final diagnoses:  Fall, initial encounter  Rib pain on left side    ED Discharge Orders         Ordered    lidocaine (LIDODERM) 5 %  Every 24 hours     05/25/18 1406           Elizabeth PalauMorelli, Birdell Frasier A, PA-C 05/25/18 1421    Maia PlanLong, Joshua G, MD 05/25/18 Rickey [id number]

## 2018-05-25 NOTE — ED Triage Notes (Signed)
Pt reports he had a fall and went to [facility]. Pt reports they did x-rays of his right shoulder and the area around the left side of his abdomen, but they were concerned it may be pressing on the spleen and sent him to be seen here.  Pt reports pain with standing but no difficulty breathing.  Pt denies LOC when he fell. Pt reports he fell on a martial arts mat and denies hitting his head.

## 2018-05-25 NOTE — ED Notes (Signed)
Return demo of incentive spiro completed x3

## 2018-06-22 ENCOUNTER — Other Ambulatory Visit: Payer: Self-pay | Admitting: Physician Assistant

## 2018-06-22 ENCOUNTER — Ambulatory Visit
Admission: RE | Admit: 2018-06-22 | Discharge: 2018-06-22 | Disposition: A | Discharge status: Home / Self Care | Payer: Medicare Other | Source: Ambulatory Visit | Attending: Physician Assistant | Admitting: Physician Assistant

## 2018-06-22 DIAGNOSIS — R0781 Pleurodynia: Secondary | ICD-10-CM

## 2019-02-09 ENCOUNTER — Ambulatory Visit: Payer: Medicare Other | Admitting: Podiatry

## 2019-03-29 ENCOUNTER — Other Ambulatory Visit: Payer: Self-pay

## 2019-03-29 ENCOUNTER — Encounter: Payer: Self-pay | Admitting: Podiatry

## 2019-03-29 ENCOUNTER — Ambulatory Visit: Payer: Medicare Other | Admitting: Podiatry

## 2019-03-29 DIAGNOSIS — M21619 Bunion of unspecified foot: Secondary | ICD-10-CM

## 2019-03-29 DIAGNOSIS — L84 Corns and callosities: Secondary | ICD-10-CM

## 2019-03-29 NOTE — Progress Notes (Signed)
This patient presents to the office with chief complaint of a painful callus right foot.  He says the callus is present on the inside border big toe right foot.  He says that the callus has quickly formed despite having the callus treated at the nail salon.Marland Kitchen  He says he is having no pain or discomfort associated with his bunion right foot.  Patient is also concerned about his orthoses.   He presents to the office to discuss his callus and bunion. And orthoses.  Vascular  Dorsalis pedis and posterior tibial pulses are palpable  B/L.  Capillary return  WNL.  Temperature gradient is  WNL.  Skin turgor  WNL  Sensorium  Senn Weinstein monofilament wire  WNL. Normal tactile sensation.  Nail Exam  Patient has normal nails with no evidence of bacterial or fungal infection.  Orthopedic  Exam  Muscle tone and muscle strength  WNL.  No limitations of motion feet  B/L.  No crepitus or joint effusion noted.  Foot type is unremarkable and digits show no abnormalities. 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  Skin  No open lesions.  Normal skin texture and turgor.  Pinch callus right hallux.  Pinch callus secondary bunion 1st MPJ  Right.  ROV.  Discussed  this condition with this pain  Told him the callus forms due to bunion right foot..  Debride pinch callus.  Told him to use a pumice stone at home.  Told him his orthoses are functioning well.  RTC prn.Gardiner Barefoot DPM

## 2019-06-24 ENCOUNTER — Ambulatory Visit: Payer: Medicare PPO | Attending: Internal Medicine

## 2019-06-24 DIAGNOSIS — Z23 Encounter for immunization: Secondary | ICD-10-CM | POA: Insufficient documentation

## 2019-06-24 NOTE — Progress Notes (Signed)
   Covid-19 Vaccination Clinic  Name:  TRESHON STANNARD    MRN: [id number] DOB: 1945-12-17  06/24/2019  Mr. Burford was observed post Covid-19 immunization for 15 minutes without incidence. He was provided with Vaccine Information Sheet and instruction to access the V-Safe system.   Mr. Hallum was instructed to call 911 with any severe reactions post vaccine: Marland Kitchen Difficulty breathing  . Swelling of your face and throat  . A fast heartbeat  . A bad rash all over your body  . Dizziness and weakness    Immunizations Administered    Name Date Dose VIS Date Route   Pfizer COVID-19 Vaccine 06/24/2019  4:48 PM 0.3 mL 04/08/2019 Intramuscular   Manufacturer: [employer], [employer]   Lot: [id number]   NDC: [address]-0

## 2019-07-19 LAB — LIPID PANEL
Cholesterol: 204 — AB (ref 0–200)
HDL: 59 (ref 35–70)
Triglycerides: 196 — AB (ref 40–160)

## 2019-07-19 LAB — VITAMIN D 25 HYDROXY (VIT D DEFICIENCY, FRACTURES): Vit D, 25-Hydroxy: 35.19

## 2019-07-19 LAB — HEMOGLOBIN A1C: Hemoglobin A1C: 4.9

## 2019-07-20 ENCOUNTER — Ambulatory Visit: Payer: Medicare PPO | Attending: Internal Medicine

## 2019-07-20 DIAGNOSIS — Z23 Encounter for immunization: Secondary | ICD-10-CM

## 2019-07-20 NOTE — Progress Notes (Signed)
   Covid-19 Vaccination Clinic  Name:  Clifford Garcia    MRN: [id number] DOB: June 23, 1945  07/20/2019  Mr. Clifford Garcia was observed post Covid-19 immunization for 15 minutes without incident. He was provided with Vaccine Information Sheet and instruction to access the V-Safe system.   Mr. Clifford Garcia was instructed to call 911 with any severe reactions post vaccine: Marland Kitchen Difficulty breathing  . Swelling of face and throat  . A fast heartbeat  . A bad rash all over body  . Dizziness and weakness   Immunizations Administered    Name Date Dose VIS Date Route   Pfizer COVID-19 Vaccine 07/20/2019  1:51 PM 0.3 mL 04/08/2019 Intramuscular   Manufacturer: [employer], [employer]   Lot: [id number]   NDC: [address]-1

## 2020-05-21 ENCOUNTER — Ambulatory Visit (INDEPENDENT_AMBULATORY_CARE_PROVIDER_SITE_OTHER): Payer: Medicare PPO

## 2020-05-21 ENCOUNTER — Ambulatory Visit (HOSPITAL_COMMUNITY)
Admission: RE | Admit: 2020-05-21 | Discharge: 2020-05-21 | Disposition: A | Discharge status: Home / Self Care | Payer: Medicare PPO | Source: Ambulatory Visit | Attending: Family Medicine | Admitting: Family Medicine

## 2020-05-21 ENCOUNTER — Other Ambulatory Visit: Payer: Self-pay

## 2020-05-21 ENCOUNTER — Encounter (HOSPITAL_COMMUNITY): Payer: Self-pay

## 2020-05-21 VITALS — BP 122/75 | HR 92 | Temp 98.5°F | Resp 13

## 2020-05-21 DIAGNOSIS — R0981 Nasal congestion: Secondary | ICD-10-CM | POA: Diagnosis not present

## 2020-05-21 DIAGNOSIS — U071 COVID-19: Secondary | ICD-10-CM

## 2020-05-21 DIAGNOSIS — J1282 Pneumonia due to coronavirus disease 2019: Secondary | ICD-10-CM | POA: Diagnosis not present

## 2020-05-21 MED ORDER — AZITHROMYCIN 250 MG PO TABS: ORAL_TABLET | ORAL | 0 refills | Status: DC

## 2020-05-21 MED ORDER — BENZONATATE 100 MG PO CAPS: 200.0000 mg | ORAL_CAPSULE | Freq: Three times a day (TID) | ORAL | 0 refills | Status: DC | PRN

## 2020-05-21 MED ORDER — PREDNISONE 20 MG PO TABS: 40.0000 mg | ORAL_TABLET | Freq: Every day | ORAL | 0 refills | Status: DC

## 2020-05-21 NOTE — Discharge Instructions (Addendum)
Start Azithromycin Take 2 tabs x 1 dose, then 1 tab every day for x 4 days. Take prednisone 40 mg once daily x 5 days. For cough take benzonatate 200 mg every 8 hours as needed for cough.

## 2020-05-21 NOTE — ED Triage Notes (Signed)
Pt states he was tested for COVID last Wednesday and got results on Friday.

## 2020-05-21 NOTE — ED Provider Notes (Signed)
[facility]    CSN: [id number] Arrival date & time: 05/21/20  1302      History   Chief Complaint Chief Complaint  Patient presents with  . Appointment    HPI Clifford Garcia is a 75 y.o. male.   HPI  Patient presents today for evaluation of persistent cough with some mild shortness of breath and chest congestion.  Patient is status post 6 days of a recent COVID-19 diagnosis.  Symptoms initially began as fever, chills and a mild cough however cough has persistently worsened over the last few days.  Patient is fully vaccinated against Covid.  He is tolerating oral intake of fluids and food.  He denies any generalized weakness has some mild fatigue.  He has not taken any medication for symptoms today.  Past Medical History:  Diagnosis Date  . Arthritis   . Eczema   . Environmental allergies   . History of gout    X 1 EPISODE AGE 57  . History of kidney stones     Patient Active Problem List   Diagnosis Date Noted  . Expected blood loss anemia 10/19/2013  . Overweight (BMI 25.0-29.9) 10/19/2013  . 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 CATARACTS REMOVED    . TOTAL HIP ARTHROPLASTY Right 10/18/2013   Procedure: RIGHT TOTAL HIP ARTHROPLASTY ANTERIOR APPROACH;  Surgeon: Shelda Pal, MD;  Location: [facility];  Service: Orthopedics;  Laterality: Right;       Home Medications    Prior to Admission medications   Medication Sig Start Date End Date Taking? Authorizing Provider  desonide (DESOWEN) 0.05 % cream  08/25/15   [provider]  hydrocortisone 2.5 % cream APP EXT AA BID 02/22/19   [provider]  lidocaine (LIDODERM) 5 % Place 1 patch onto the skin daily. Remove & Discard patch within 12 hours or as directed by MD 05/25/18   Bill Salinas, PA-C    Family History No family history on file.  Social History Social History   Tobacco Use  . Smoking status: Former Smoker    Quit  date: 10/13/1984    Years since quitting: 35.6  . Smokeless tobacco: Never Used  Substance Use Topics  . Alcohol use: Never    Comment: OCCASIONAL  . Drug use: Never     Allergies   Tetracycline hcl and Tetracyclines & related   Review of Systems Review of Systems Pertinent negatives listed in HPI Physical Exam Triage Vital Signs ED Triage Vitals  Enc Vitals Group     BP      Pulse      Resp      Temp      Temp src      SpO2      Weight      Height      Head Circumference      Peak Flow      Pain Score      Pain Loc      Pain Edu?      Excl. in GC?    No data found.  Updated Vital Signs BP 122/75 (BP Location: Right Arm)   Pulse 92   Temp 98.5 F (36.9 C) (Oral)   Resp 13   SpO2 96%   Visual Acuity Right Eye Distance:   Left Eye Distance:   Bilateral Distance:    Right Eye Near:   Left Eye Near:  Bilateral Near:     Physical Exam General appearance: alert, non-acutely  Ill-appearing, no distress Head: Normocephalic, without obvious abnormality, atraumatic ENT: External ears, nares patent, oropharynx patent   Respiratory: Respirations even , unlabored, diminished air movement, no rales, wheeze, or crackles Heart: Rate and rhythm normal. No gallop or murmurs noted on exam  Abdomen: BS +, no distention, no rebound tenderness, or no mass Extremities: No gross deformities Skin: Skin color, texture, turgor normal. No rashes seen  Psych: Appropriate mood and affect. Neurologic:: Alert, oriented to person, place, and time, thought content appropriate. UC Treatments / Results  Labs (all labs ordered are listed, but only abnormal results are displayed) Labs Reviewed - No data to display  EKG   Radiology DG Chest 2 View  Result Date: 05/21/2020 CLINICAL DATA:  Chest congestion.  COVID-19 positive EXAM: CHEST - 2 VIEW COMPARISON:  June 22, 2018 FINDINGS: Areas of ill-defined opacity are noted in the right upper lobe as well as in each lower lobe  and mid lung region. No consolidation. Heart size and pulmonary vascularity are normal. No adenopathy. There is degenerative change in the thoracic spine. IMPRESSION: Multifocal airspace opacity consistent with multifocal pneumonia, likely of atypical organism etiology. This infiltrate is most prominent in the right upper lobe. No consolidation. Heart size normal. No evident adenopathy. These results will be called to the ordering clinician or representative by the Radiologist Assistant, and communication documented in the PACS or [employer]. Electronically Signed   By: Bretta Bang III M.D.   On: 05/21/2020 14:09   Procedures Procedures (including critical care time)  Medications Ordered in UC Medications - No data to display  Initial Impression / Assessment and Plan / UC Course  I have reviewed the triage vital signs and the nursing notes.  Pertinent labs & imaging results that were available during my care of the patient were reviewed by me and considered in my medical decision making (see chart for details).     Chest x-ray confirms multifocal pneumonia which is consistent with a Covid type of pneumonia.  Overall patient's appearance and his vital signs are grossly reassuring.  Will treat patient per discharge medication instructions.  Red flag precautions given warranting immediate evaluation stated at the ER.  Patient verbalized understanding and agreement with plan.  Final Clinical Impressions(s) / UC Diagnoses   Final diagnoses:  Pneumonia due to COVID-19 virus     Discharge Instructions     Start Azithromycin Take 2 tabs x 1 dose, then 1 tab every day for x 4 days. Take prednisone 40 mg once daily x 5 days. For cough take benzonatate 200 mg every 8 hours as needed for cough.     ED Prescriptions    Medication Sig Dispense Auth. Provider   azithromycin (ZITHROMAX) 250 MG tablet Take 2 tabs PO x 1 dose, then 1 tab PO QD x 4 days 6 tablet Bing Neighbors, FNP    predniSONE (DELTASONE) 20 MG tablet Take 2 tablets (40 mg total) by mouth daily with breakfast. 10 tablet Bing Neighbors, FNP   benzonatate (TESSALON) 100 MG capsule Take 2 capsules (200 mg total) by mouth 3 (three) times daily as needed for cough. 30 capsule Bing Neighbors, FNP     PDMP not reviewed this encounter.   Bing Neighbors, FNP 05/23/20 1740

## 2020-05-21 NOTE — ED Triage Notes (Signed)
Pt states he has been having a cough, fever, chills, fatigue x 1 week. He states the sxs have gotten worse. Pt reports having two test done at [facility], one resulted negative and the other positive. He states he is concerned about the chest congestion he has been having off and on for 3 weeks.

## 2020-07-31 ENCOUNTER — Ambulatory Visit: Payer: Medicare PPO | Attending: Internal Medicine

## 2020-07-31 ENCOUNTER — Other Ambulatory Visit: Payer: Self-pay

## 2020-07-31 DIAGNOSIS — Z23 Encounter for immunization: Secondary | ICD-10-CM

## 2020-07-31 NOTE — Progress Notes (Signed)
   Covid-19 Vaccination Clinic  Name:  BROOKES CRAINE    MRN: [id number] DOB: 05-07-1945  07/31/2020  Mr. Westerfeld was observed post Covid-19 immunization for 15 minutes without incident. He was provided with Vaccine Information Sheet and instruction to access the V-Safe system.   Mr. Ulbricht was instructed to call 911 with any severe reactions post vaccine: Marland Kitchen Difficulty breathing  . Swelling of face and throat  . A fast heartbeat  . A bad rash all over body  . Dizziness and weakness   Immunizations Administered    Name Date Dose VIS Date Route   PFIZER Comrnaty(Gray TOP) Covid-19 Vaccine 07/31/2020  1:55 PM 0.3 mL 04/05/2020 Intramuscular   Manufacturer: [employer], [employer]   Lot: [id number]   NDC: [phone]

## 2020-08-03 ENCOUNTER — Other Ambulatory Visit (HOSPITAL_BASED_OUTPATIENT_CLINIC_OR_DEPARTMENT_OTHER): Payer: Self-pay

## 2020-08-09 ENCOUNTER — Other Ambulatory Visit (HOSPITAL_BASED_OUTPATIENT_CLINIC_OR_DEPARTMENT_OTHER): Payer: Self-pay

## 2020-08-09 MED ORDER — COVID-19 MRNA VACCINE (PFIZER) 30 MCG/0.3ML IM SUSP
INTRAMUSCULAR | 0 refills | Status: DC
  Filled 2020-08-09: qty 0.3, 1d supply, fill #0

## 2020-08-10 ENCOUNTER — Other Ambulatory Visit (HOSPITAL_BASED_OUTPATIENT_CLINIC_OR_DEPARTMENT_OTHER): Payer: Self-pay

## 2020-08-17 ENCOUNTER — Ambulatory Visit: Payer: Self-pay | Admitting: *Deleted

## 2020-08-17 NOTE — Telephone Encounter (Signed)
Spoke with patient- he has been scheduled for a NP appointment- but due to his symptoms was triaged- Patient states he has been off balance and experiencing brain fog since his COVID diagnosis earlier this year. Patient states he has more off balance than dizziness. Patient states he does not have hypertension or diabetes and is active and stays hydrated. Advised UC for symptoms before his appointment- he does not feel he needs that now- but does understand to be seen if his symptoms get worse. Discussed post COVID symptoms- brain fog and other physical changes that can occur long after COVID.   Reason for Disposition . [1] MILD dizziness (e.g., walking normally) AND [2] has NOT been evaluated by physician for this  (Exception: dizziness caused by heat exposure, sudden standing, or poor fluid intake)  Answer Assessment - Initial Assessment Questions 1. DESCRIPTION: "Describe your dizziness."     Feels unsteady- post COVID symptoms 2. LIGHTHEADED: "Do you feel lightheaded?" (e.g., somewhat faint, woozy, weak upon standing)     No dizziness , nausea 3. VERTIGO: "Do you feel like either you or the room is spinning or tilting?" (i.e. vertigo)     no 4. SEVERITY: "How bad is it?"  "Do you feel like you are going to faint?" "Can you stand and walk?"   - MILD: Feels slightly dizzy, but walking normally.   - MODERATE: Feels very unsteady when walking, but not falling; interferes with normal activities (e.g., school, work) .   - SEVERE: Unable to walk without falling, or requires assistance to walk without falling; feels like passing out now.      mild 5. ONSET:  "When did the dizziness begin?"     Since- Dec/Jan- worse during COVID diagnosis 6. AGGRAVATING FACTORS: "Does anything make it worse?" (e.g., standing, change in head position)     Morning is worse- or during the night 7. HEART RATE: "Can you tell me your heart rate?" "How many beats in 15 seconds?"  (Note: not all patients can do this)        normal 8. CAUSE: "What do you think is causing the dizziness?"     Patient thinks post COVID 9. RECURRENT SYMPTOM: "Have you had dizziness before?" If Yes, ask: "When was the last time?" "What happened that time?"     no 10. OTHER SYMPTOMS: "Do you have any other symptoms?" (e.g., fever, chest pain, vomiting, diarrhea, bleeding)       no 11. PREGNANCY: "Is there any chance you are pregnant?" "When was your last menstrual period?"       n/a  Protocols used: DIZZINESS [facility]

## 2020-08-24 ENCOUNTER — Other Ambulatory Visit (HOSPITAL_BASED_OUTPATIENT_CLINIC_OR_DEPARTMENT_OTHER): Payer: Self-pay

## 2020-09-02 IMAGING — CR DG RIBS W/ CHEST 3+V*L*
3 series · 3 of 3 positions shown · non-contrast
Comparison: None.

CLINICAL DATA: Acute LEFT rib pain following fall 2 days ago.
Initial encounter.

EXAM:
LEFT RIBS AND CHEST - 3+ VIEW

[w chest pa]
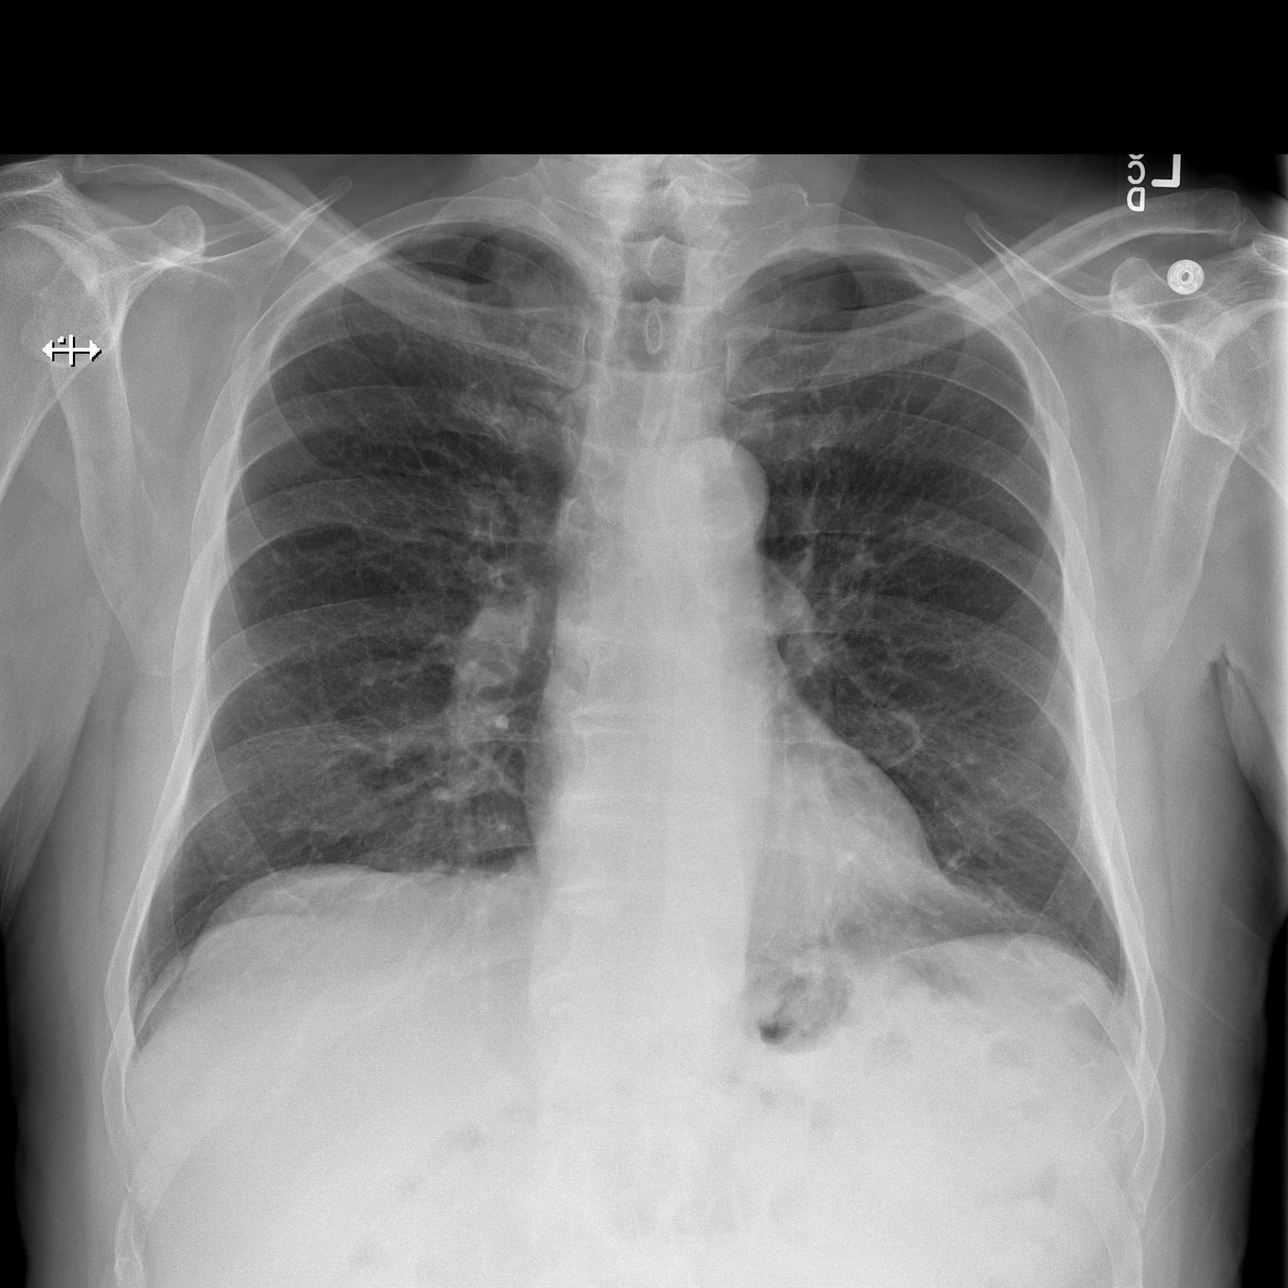

[w ribs ap lower left]
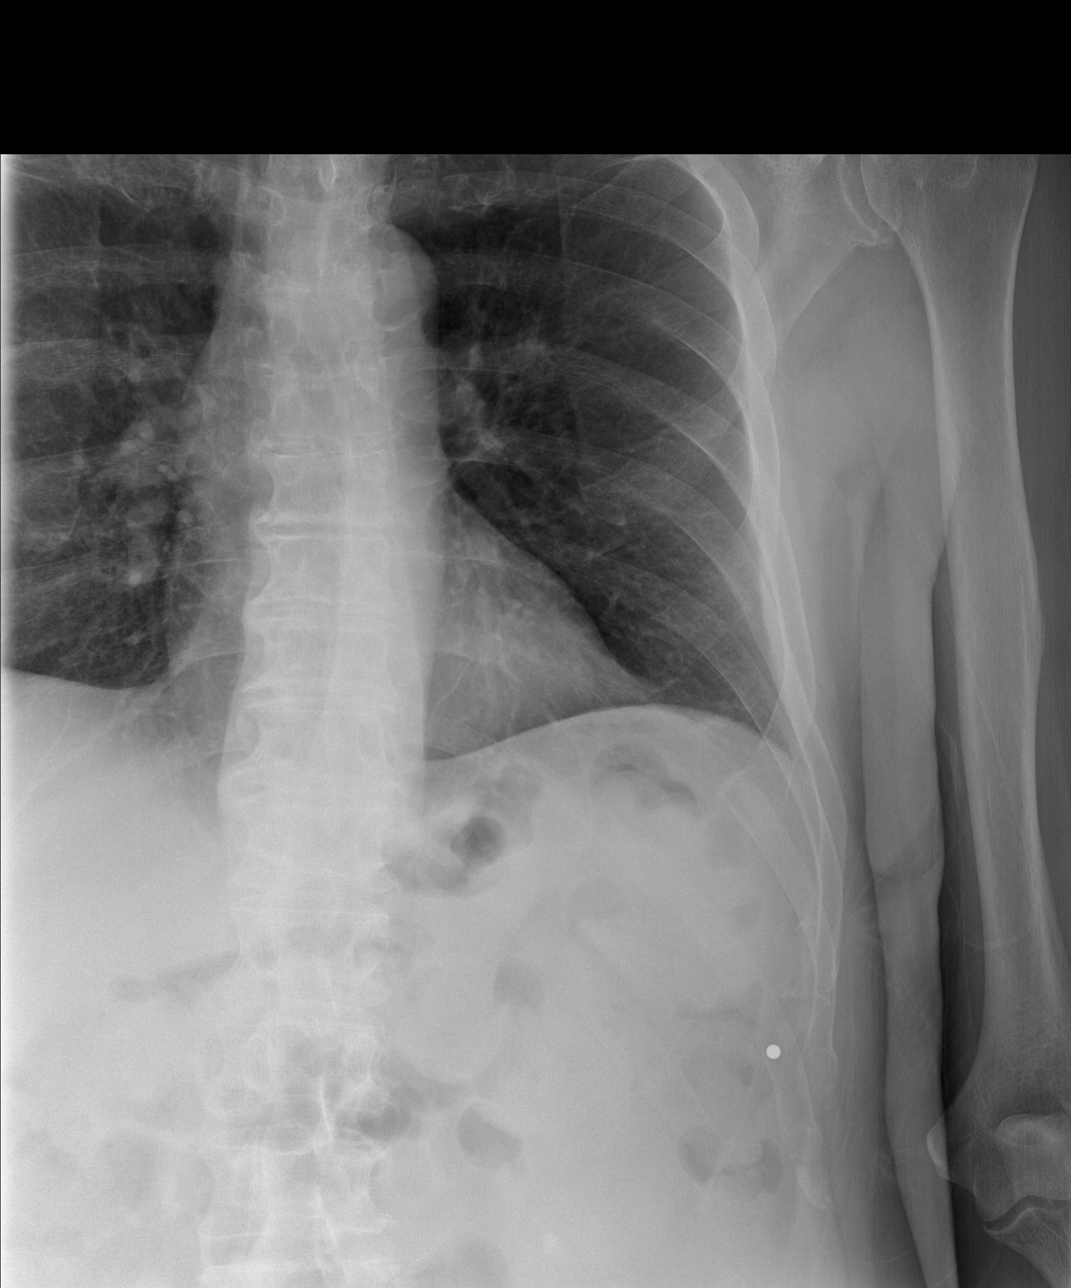

[w ribs obl left]
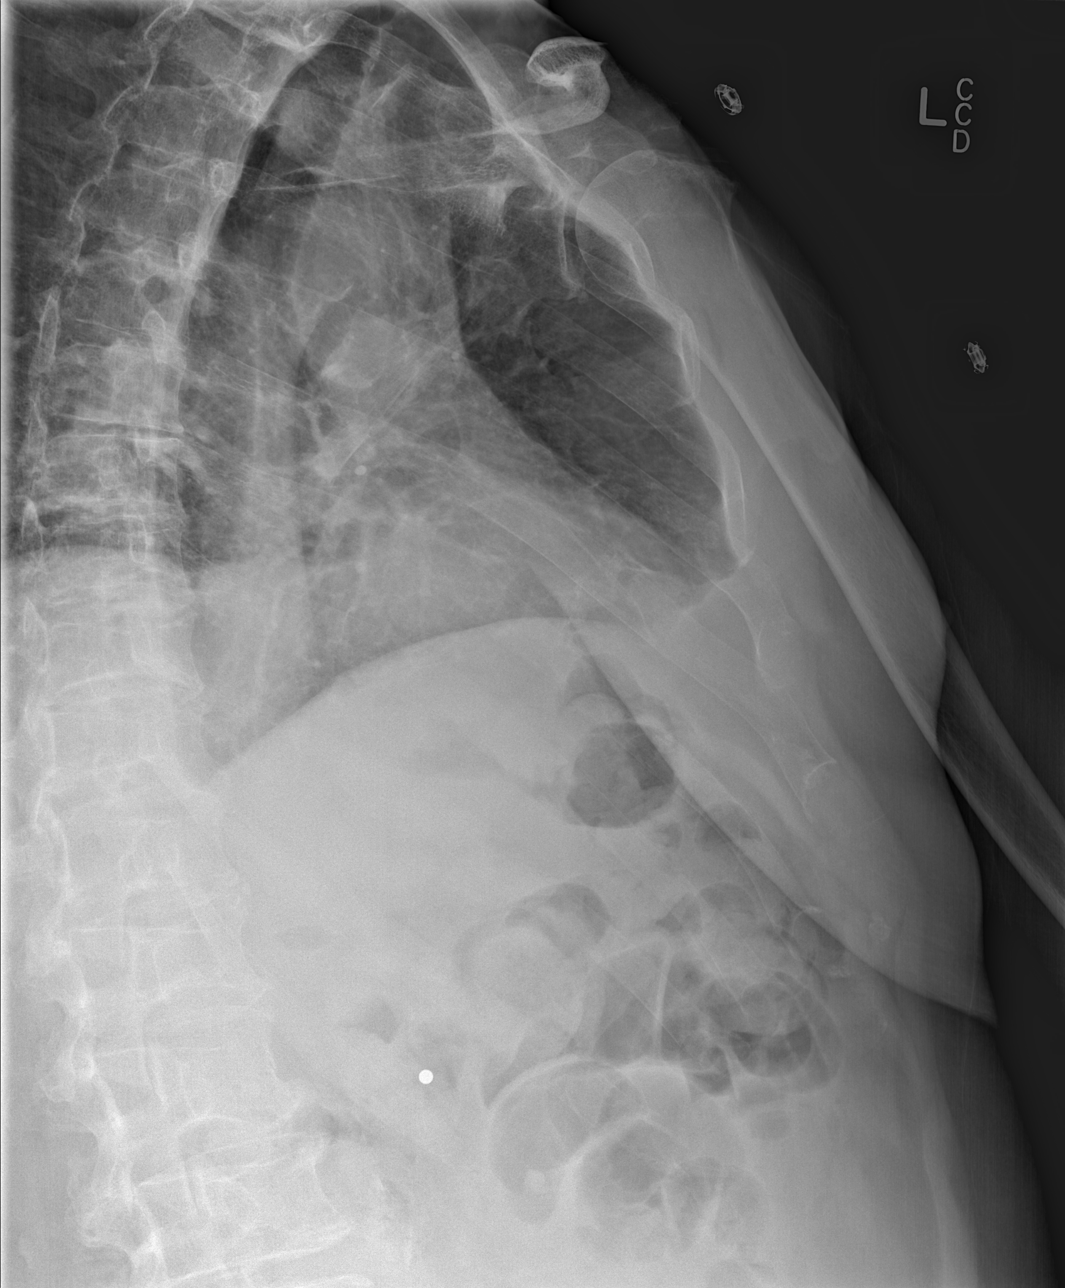

[3 of 3 positions shown; findings below may reference images not displayed]

FINDINGS: No fracture or other bone lesions are seen involving the ribs. There
is no evidence of pneumothorax or pleural effusion. Both lungs are
clear. Heart size and mediastinal contours are within normal limits.
IMPRESSION: Negative.

## 2020-09-14 ENCOUNTER — Ambulatory Visit: Payer: Medicare PPO | Admitting: Internal Medicine

## 2020-09-14 ENCOUNTER — Encounter: Payer: Self-pay | Admitting: Internal Medicine

## 2020-09-14 ENCOUNTER — Other Ambulatory Visit: Payer: Self-pay

## 2020-09-14 VITALS — BP 120/80 | HR 74 | Temp 98.6°F | Ht 70.0 in | Wt 174.9 lb

## 2020-09-14 DIAGNOSIS — R5383 Other fatigue: Secondary | ICD-10-CM | POA: Diagnosis not present

## 2020-09-14 DIAGNOSIS — Z23 Encounter for immunization: Secondary | ICD-10-CM

## 2020-09-14 DIAGNOSIS — R413 Other amnesia: Secondary | ICD-10-CM | POA: Diagnosis not present

## 2020-09-14 NOTE — Addendum Note (Signed)
Addended by: Kern Reap B on: 09/14/2020 04:51 PM   Modules accepted: Orders

## 2020-09-14 NOTE — Progress Notes (Signed)
New Patient Office Visit     This visit occurred during the SARS-CoV-2 public health emergency.  Safety protocols were in place, including screening questions prior to the visit, additional usage of staff PPE, and extensive cleaning of exam room while observing appropriate contact time as indicated for disinfecting solutions.    CC/Reason for Visit: Establish care, discuss chronic medical conditions and some acute concerns Previous PCP: At [facility] Last Visit: Last year  HPI: Clifford Garcia is a 75 y.o. male who is coming in today for the above mentioned reasons. Past Medical History is significant for: Rosacea.  He tested positive for COVID in January and had COVID-pneumonia he was prescribed steroids and antibiotics.  Ever since then he has been describing a brain fog situation with extreme fatigue.  He has been having some cognitive issues that are quite concerning to him.  For example in December he mailed his daughter a Christmas present to the wrong address, he has sent invoices to the wrong clients, he cannot remember email addresses that he has known for years.  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  He was a smoker but quit in 1989.  He drinks alcohol occasionally, has no known drug allergies, his past surgical history is significant for right hip replacement in 2015.  He is overdue for his fourth COVID, shingles, pneumonia, Tdap vaccines.  He had a colonoscopy in 2020 and is a 5-year callback.   Past Medical/Surgical History: Past Medical History:  Diagnosis Date  . Arthritis   . Eczema   . Environmental allergies   . History of gout    X 1 EPISODE AGE 43  . History of kidney stones     Past Surgical History:  Procedure Laterality Date  . CATARACTS REMOVED    . 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 He has never used smokeless tobacco. He reports current alcohol use. He reports that he does not use drugs.  Allergies: Allergies  Allergen Reactions  . Tetracycline Hcl Diarrhea    Bloated stomach   . Tetracyclines & Related Diarrhea    Bloated stomach     Family History:  Family History  Problem Relation Age of Onset  . CAD Father   . Leukemia Brother      Current Outpatient Medications:  .  desonide (DESOWEN) 0.05 % cream, , Disp: , Rfl:  .  hydrocortisone 2.5 % cream, APP EXT AA BID, Disp: , Rfl:  .  lidocaine (LIDODERM) 5 %, Place 1 patch onto the skin daily. Remove & Discard patch within 12 hours or as directed by MD, Disp: 30 patch, Rfl: 0  Review of Systems:  Constitutional: Denies fever, chills, diaphoresis, appetite change and fatigue.  HEENT: Denies photophobia, eye pain, redness, hearing loss, ear pain, congestion, sore throat, rhinorrhea, sneezing, mouth sores, trouble swallowing, neck pain, neck stiffness and tinnitus.   Respiratory: Denies SOB, DOE, cough, chest tightness,  and wheezing.   Cardiovascular: Denies chest pain, palpitations and leg swelling.  Gastrointestinal: Denies nausea, vomiting, abdominal pain, diarrhea, constipation, blood in stool and abdominal distention.  Genitourinary: Denies dysuria, urgency, frequency, hematuria, flank pain and difficulty urinating.  Endocrine: Denies: hot or cold intolerance, sweats, changes in hair  or nails, polyuria, polydipsia. Musculoskeletal: Denies myalgias, back pain, joint swelling, arthralgias and gait problem.  Skin: Denies pallor, rash and wound.  Neurological: Denies dizziness, seizures, syncope, weakness, light-headedness, numbness and headaches.  Hematological: Denies adenopathy. Easy bruising, personal or family bleeding history  Psychiatric/Behavioral: Denies suicidal ideation, mood changes, confusion,  nervousness, sleep disturbance and agitation    Physical Exam: Vitals:   09/14/20 1433  BP: 120/80  Pulse: 74  Temp: 98.6 F (37 C)  TempSrc: Oral  SpO2: 97%  Weight: 174 lb 14.4 oz (79.3 kg)  Height: 5\' 10"  (1.778 m)   Body mass index is 25.1 kg/m.  Constitutional: NAD, calm, comfortable Eyes: PERRL, lids and conjunctivae normal ENMT: Mucous membranes are moist.  Respiratory: clear to auscultation bilaterally, no wheezing, no crackles. Normal respiratory effort. No accessory muscle use.  Cardiovascular: Regular rate and rhythm, no murmurs / rubs / gallops. No extremity edema. Neurologic: Grossly intact and nonfocal Psychiatric: Normal judgment and insight. Alert and oriented x 3. Normal mood.    Impression and Plan:  Fatigue, unspecified type  Memory loss -Check for reversible causes of dementia with TSH, B12, D. -MMSE at next in office visit. 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  Need for vaccination against Streptococcus pneumoniae -PCV 20 administered today.  Time spent: 34 minutes reviewing chart, interviewing and examining patient and formulating plan of care.    Patient Instructions  -Nice seeing you today!!  -Lab work today; will notify you once results are available.  -Pneumonia vaccine today.  -Remember to ger your 4th COVID, tetanus and shingles vaccines at your pharmacy.  -Schedule follow up in 3 months for your physical. Please come in fasting that day.     , MD [facility]

## 2020-09-14 NOTE — Patient Instructions (Signed)
-  Nice seeing you today!!  -Lab work today; will notify you once results are available.  -Pneumonia vaccine today.  -Remember to ger your 4th COVID, tetanus and shingles vaccines at your pharmacy.  -Schedule follow up in 3 months for your physical. Please come in fasting that day.

## 2020-09-15 LAB — VITAMIN B12: Vitamin B-12: 753 pg/mL (ref 200–1100)

## 2020-09-15 LAB — TSH: TSH: 1.85 mIU/L (ref 0.40–4.50)

## 2020-09-15 LAB — VITAMIN D 25 HYDROXY (VIT D DEFICIENCY, FRACTURES): Vit D, 25-Hydroxy: 48 ng/mL (ref 30–100)

## 2020-09-21 ENCOUNTER — Encounter: Payer: Self-pay | Admitting: Internal Medicine

## 2020-11-05 ENCOUNTER — Ambulatory Visit: Payer: Medicare PPO | Admitting: Internal Medicine

## 2020-11-29 ENCOUNTER — Ambulatory Visit: Payer: Medicare PPO | Admitting: Internal Medicine

## 2020-12-17 ENCOUNTER — Other Ambulatory Visit: Payer: Self-pay

## 2020-12-18 ENCOUNTER — Ambulatory Visit (INDEPENDENT_AMBULATORY_CARE_PROVIDER_SITE_OTHER): Payer: Medicare PPO | Admitting: Internal Medicine

## 2020-12-18 ENCOUNTER — Encounter: Payer: Self-pay | Admitting: Internal Medicine

## 2020-12-18 VITALS — BP 110/70 | HR 85 | Temp 98.6°F | Ht 68.0 in | Wt 173.8 lb

## 2020-12-18 DIAGNOSIS — Z0000 Encounter for general adult medical examination without abnormal findings: Secondary | ICD-10-CM | POA: Diagnosis not present

## 2020-12-18 DIAGNOSIS — R4189 Other symptoms and signs involving cognitive functions and awareness: Secondary | ICD-10-CM

## 2020-12-18 DIAGNOSIS — H9193 Unspecified hearing loss, bilateral: Secondary | ICD-10-CM | POA: Diagnosis not present

## 2020-12-18 LAB — COMPREHENSIVE METABOLIC PANEL
ALT: 19 U/L (ref 0–53)
AST: 19 U/L (ref 0–37)
Albumin: 4.1 g/dL (ref 3.5–5.2)
Alkaline Phosphatase: 49 U/L (ref 39–117)
BUN: 19 mg/dL (ref 6–23)
CO2: 27 mEq/L (ref 19–32)
Calcium: 9.3 mg/dL (ref 8.4–10.5)
Chloride: 104 mEq/L (ref 96–112)
Creatinine, Ser: 1.15 mg/dL (ref 0.40–1.50)
GFR: 61.35 mL/min (ref >60.00)
Glucose, Bld: 87 mg/dL (ref 70–99)
Potassium: 3.9 mEq/L (ref 3.5–5.1)
Sodium: 139 mEq/L (ref 135–145)
Total Bilirubin: 0.7 mg/dL (ref 0.2–1.2)
Total Protein: 6.8 g/dL (ref 6.0–8.3)

## 2020-12-18 LAB — CBC WITH DIFFERENTIAL/PLATELET
Basophils Absolute: 0.1 10*3/uL (ref 0.0–0.1)
Basophils Relative: 0.9 % (ref 0.0–3.0)
Eosinophils Absolute: 0.1 10*3/uL (ref 0.0–0.7)
Eosinophils Relative: 1.7 % (ref 0.0–5.0)
HCT: 45.6 % (ref 39.0–52.0)
Hemoglobin: 15.7 g/dL (ref 13.0–17.0)
Lymphocytes Relative: 21.4 % (ref 12.0–46.0)
Lymphs Abs: 1.2 10*3/uL (ref 0.7–4.0)
MCHC: 34.4 g/dL (ref 30.0–36.0)
MCV: 90 fl (ref 78.0–100.0)
Monocytes Absolute: 0.6 10*3/uL (ref 0.1–1.0)
Monocytes Relative: 10.5 % (ref 3.0–12.0)
Neutro Abs: 3.7 10*3/uL (ref 1.4–7.7)
Neutrophils Relative %: 65.5 % (ref 43.0–77.0)
Platelets: 180 10*3/uL (ref 150.0–400.0)
RBC: 5.07 Mil/uL (ref 4.22–5.81)
RDW: 14 % (ref 11.5–15.5)
WBC: 5.6 10*3/uL (ref 4.0–10.5)

## 2020-12-18 LAB — LIPID PANEL
Cholesterol: 198 mg/dL (ref 0–200)
HDL: 55.1 mg/dL (ref >39.00)
LDL Cholesterol: 116 mg/dL — ABNORMAL HIGH (ref 0–99)
NonHDL: 143.26
Total CHOL/HDL Ratio: 4
Triglycerides: 135 mg/dL (ref 0.0–149.0)
VLDL: 27 mg/dL (ref 0.0–40.0)

## 2020-12-18 LAB — HEMOGLOBIN A1C: Hgb A1c MFr Bld: 5 % (ref 4.6–6.5)

## 2020-12-18 NOTE — Progress Notes (Signed)
Established Patient Office Visit     This visit occurred during the SARS-CoV-2 public health emergency.  Safety protocols were in place, including screening questions prior to the visit, additional usage of staff PPE, and extensive cleaning of exam room while observing appropriate contact time as indicated for disinfecting solutions.    CC/Reason for Visit: Annual preventive exam and subsequent Medicare wellness visit  HPI: Clifford Garcia is a 75 y.o. male who is coming in today for the above mentioned reasons. Past Medical History is significant for: Rosacea.He has been doing well and has no concerns.  He feels like the cognitive issues he was experiencing during her last visit have resolved and he believes they were secondary to [place].  He has routine eye and dental care.  He does have decreased hearing and wears a hearing aid in the right ear, he exercises by doing yoga, tai chi and walking.  He had a colonoscopy in 2020 that is a 5-year callback.  He is overdue for his second COVID booster, shingles vaccine.  He was recently diagnosed with macular degeneration by his ophthalmologist and is taking supplements.   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 His smoking use included cigarettes. He has never used smokeless tobacco. He reports current alcohol use. He reports that he does not use drugs.  Allergies: Allergies  Allergen Reactions   Tetracycline Hcl Diarrhea    Bloated stomach     Tetracyclines & Related Diarrhea    Bloated stomach     Family History:  Family History  Problem Relation Age of Onset   CAD Father    Leukemia Brother      Current Outpatient Medications:    desonide (DESOWEN) 0.05 % cream, , Disp: , Rfl:    hydrocortisone 2.5 % cream, APP EXT AA BID, Disp: , Rfl:    lidocaine (LIDODERM) 5 %, Place 1 patch onto the skin daily. Remove & Discard patch within 12 hours or as directed by MD, Disp: 30 patch, Rfl: 0  Review of Systems:  Constitutional: Denies fever, chills, diaphoresis, appetite change and fatigue.  HEENT: Denies photophobia, eye pain, redness, hearing loss, ear pain, congestion, sore throat, rhinorrhea, sneezing, mouth sores, trouble swallowing, neck pain, neck stiffness and tinnitus.   Respiratory: Denies SOB, DOE, cough, chest tightness,  and wheezing.   Cardiovascular: Denies chest pain, palpitations and leg swelling.  Gastrointestinal: Denies nausea, vomiting, abdominal pain, diarrhea, constipation, blood in stool and abdominal distention.  Genitourinary: Denies dysuria, urgency, frequency, hematuria, flank pain and difficulty urinating.  Endocrine: Denies: hot or cold intolerance, sweats, changes in hair or nails, polyuria, polydipsia. Musculoskeletal: Denies myalgias, back pain, joint swelling, arthralgias and gait problem.  Skin: Denies pallor, rash and wound.  Neurological: Denies dizziness, seizures, syncope, weakness, light-headedness, numbness and headaches.  Hematological: Denies adenopathy. Easy bruising, personal or family bleeding history  Psychiatric/Behavioral: Denies suicidal ideation, mood changes, confusion, nervousness, sleep  disturbance and agitation    Physical Exam: Vitals:   12/18/20 1127  BP: 110/70  Pulse: 85  Temp: 98.6 F (37 C)  TempSrc: Oral  SpO2: 96%  Weight: 173 lb 12.8 oz (78.8 kg)  Height: '5\' 8"'  (1.727 m)    Body mass index is 26.43 kg/m.   Constitutional: NAD, calm, comfortable Eyes:  PERRL, lids and conjunctivae normal ENMT: Mucous membranes are moist. Posterior pharynx clear of any exudate or lesions. Normal dentition. Tympanic membrane is pearly white, no erythema or bulging. Neck: normal, supple, no masses, no thyromegaly Respiratory: clear to auscultation bilaterally, no wheezing, no crackles. Normal respiratory effort. No accessory muscle use.  Cardiovascular: Regular rate and rhythm, no murmurs / rubs / gallops. No extremity edema. 2+ pedal pulses. No carotid bruits.  Abdomen: no tenderness, no masses palpated. No hepatosplenomegaly. Bowel sounds positive.  Musculoskeletal: no clubbing / cyanosis. No joint deformity upper and lower extremities. Good ROM, no contractures. Normal muscle tone.  Skin: no rashes, lesions, ulcers. No induration Neurologic: CN 2-12 grossly intact. Sensation intact, DTR normal. Strength 5/5 in all 4.  Psychiatric: Normal judgment and insight. Alert and oriented x 3. Normal mood.   Subsequent Medicare wellness visit   1. Risk factors, based on past  M,S,F -cardiovascular disease risk factors include age, gender.   2.  Physical activities: Very physically active with yoga, tai chi, walking   3.  Depression/mood: Stable, not depressed   4.  Hearing: Significant auditory issues, not interested in audiology referral   5.  ADL's: Independent in all ADLs   6.  Fall risk: Low fall risk   7.  Home safety: No problems identified   8.  Height weight, and visual acuity: height and weight as above, vision:  Vision Screening   Right eye Left eye Both eyes  Without correction na na na  With correction     Comments: Patient did not bring glasses for distance   9.  Counseling: Advised he update his vaccination status   10. Lab orders based on risk factors: Laboratory update will be reviewed   11. Referral : None today   12. Care plan: Follow-up with me in 6 to 12 months   13. Cognitive assessment: No cognitive impairment   14. Screening:  Patient provided with a written and personalized 5-10 year screening schedule in the AVS. yes   15. Provider List Update: PCP only  16. Advance Directives: Full code   17. Opioids: Patient is not on any opioid prescriptions and has no risk factors for a substance use disorder.   [place] Office Visit from 12/18/2020 in [place] [facility] [place]  PHQ-9 Total Score 4       Fall Risk  09/14/2020  Falls in the past year? 0  Number falls in past yr: 0  Injury with Fall? 0     Impression and Plan:  Encounter for preventive health examination -He has routine eye and dental care. -He is due for his second COVID-vaccine, shingles vaccination as well as a flu vaccine soon.  Tdap and pneumonia are updated. -Screening labs today. -Healthy lifestyle discussed in detail. -He had a colonoscopy in 2020 and is a 5-year callback.  Bilateral hearing loss, unspecified hearing loss type -He wears a hearing aid on the right, cannot afford bilateral hearing aids.  Cognitive changes   MMSE - Mini Mental State Exam 12/18/2020  Orientation to time 5  Orientation to Place 5  Registration 3  Attention/ Calculation 5  Recall 3  Language- name 2 objects 2  Language- repeat 1  Language- follow 3 step command 3  Language- read & follow direction 1  Write a sentence 1  Copy design 1  Total score 30    -He scored 30 on MMSE. -Vitamin D and B12 were normal at last visit as well as TSH. -He feels like all these issues have been resolved. -I wonder whether this could have been brain fog from COVID infection that has now resolved.   Patient Instructions  -Nice seeing you today!!  -Lab work today; will notify you once results are available.  -Remember your 2nd COVID booster and shingles and flu vaccines at the pharmacy.  -Your pneumonia vaccines are complete.  -Schedule follow up in 1 year or sooner as needed.     Lelon Frohlich, MD [facility] at  [facility]

## 2020-12-18 NOTE — Patient Instructions (Signed)
-  Nice seeing you today!!  -Lab work today; will notify you once results are available.  -Remember your 2nd COVID booster and shingles and flu vaccines at the pharmacy.  -Your pneumonia vaccines are complete.  -Schedule follow up in 1 year or sooner as needed.

## 2021-02-05 ENCOUNTER — Encounter: Payer: Self-pay | Admitting: Family Medicine

## 2021-02-05 ENCOUNTER — Telehealth: Payer: Medicare PPO | Admitting: Family Medicine

## 2021-02-05 DIAGNOSIS — U071 COVID-19: Secondary | ICD-10-CM | POA: Diagnosis not present

## 2021-02-05 MED ORDER — BENZONATATE 100 MG PO CAPS: 100.0000 mg | ORAL_CAPSULE | Freq: Three times a day (TID) | ORAL | 0 refills | Status: DC | PRN

## 2021-02-05 NOTE — Patient Instructions (Addendum)
  HOME CARE TIPS:  -COVID19 testing information: GoldAgenda.is  Most pharmacies also offer testing and home test kits. http://www.vasquez-vaughn.biz/?click_source=alert  -I sent the medication(s) we discussed to your pharmacy: Meds ordered this encounter  Medications   benzonatate (TESSALON PERLES) 100 MG capsule    Sig: Take 1 capsule (100 mg total) by mouth 3 (three) times daily as needed.    Dispense:  20 capsule    Refill:  0     -can use nasal saline a few times per day if you have nasal congestion  -stay hydrated, drink plenty of fluids and eat small healthy meals - avoid dairy  -If the Covid test is positive, check out the [facility] website for more information on home care, transmission and treatment for COVID19  -follow up with your doctor in 2-3 days unless improving and feeling better   It was nice to meet you today, and I really hope you are feeling better soon. I help [place] out with telemedicine visits on Tuesdays and Thursdays and am available for visits on those days. If you have any concerns or questions following this visit please schedule a follow up visit with your Primary Care doctor or seek care at a local urgent care clinic to avoid delays in care.    Seek in person care or schedule a follow up video visit promptly if your symptoms worsen, new concerns arise or you are not improving with treatment. Call 911 and/or seek emergency care if your symptoms are severe or life threatening.

## 2021-02-05 NOTE — Progress Notes (Signed)
Virtual Visit via Video Note  I connected with Clifford Garcia  on 02/05/21 at 10:20 AM EDT by a video enabled telemedicine application and verified that I am speaking with the correct person using two identifiers.  Location patient: home, [place] Location provider:work or home office Persons participating in the virtual visit: patient, provider  I discussed the limitations of evaluation and management by telemedicine and the availability of in person appointments. The patient expressed understanding and agreed to proceed.   HPI:  Acute telemedicine visit for Cough: -Onset: 5 days ago -his partner had the same symptoms -Symptoms include: "head cold," sore throat, stuffy nose, cough -Denies:fever, CP, SOB, NVD, inability to eat/drink/get out of bed -Has tried:nyquil -Pertinent past medical history:see below; he had covid in January -Pertinent medication allergies:  Allergies  Allergen Reactions   Tetracycline Hcl Diarrhea    Bloated stomach    Tetracyclines & Related Diarrhea    Bloated stomach   -COVID-19 vaccine status: 2 doses and 1 booster; has not had flu shot yet  ROS: See pertinent positives and negatives per HPI.  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   Procedure: RIGHT TOTAL HIP ARTHROPLASTY ANTERIOR APPROACH;  Surgeon: Shelda Pal, MD;  Location: [facility];  Service: Orthopedics;  Laterality: Right;     Current Outpatient Medications:    benzonatate (TESSALON PERLES) 100 MG capsule, Take 1 capsule (100 mg total) by mouth 3 (three) times daily as needed., Disp: 20 capsule, Rfl: 0   desonide (DESOWEN) 0.05 % cream, , Disp: , Rfl:    hydrocortisone 2.5 % cream, APP EXT AA BID, Disp: , Rfl:   EXAM:  VITALS per patient if  applicable:  GENERAL: alert, oriented, appears well and in no acute distress  HEENT: atraumatic, conjunttiva clear, no obvious abnormalities on inspection of external nose and ears  NECK: normal movements of the head and neck  LUNGS: on inspection no signs of respiratory distress, breathing rate appears normal, no obvious gross SOB, gasping or wheezing  CV: no obvious cyanosis  MS: moves all visible extremities without noticeable abnormality  PSYCH/NEURO: pleasant and cooperative, no obvious depression or anxiety, speech and thought processing grossly intact  ASSESSMENT AND PLAN:  Discussed the following assessment and plan:  COVID-19  -we discussed possible serious and likely etiologies, options for evaluation and workup, limitations of telemedicine visit vs in person visit, treatment, treatment risks and precautions. Pt is agreeable to treatment via telemedicine at this moment.  Query VURI, possible Covid19 vs other. He feels he is improving. Opted for tessalon for cough, covid testing (discussed options, isolation, etc.) and symptomatic care per pt instructions.  Work/School slipped offered: declined Advised to seek prompt in person care if worsening, new symptoms arise, or if is not improving with treatment. Discussed options for inperson care if PCP office not available. Did let this patient know that I only do telemedicine on Tuesdays and Thursdays for [place]. Advised to schedule follow up visit with PCP or [facility] if any further questions or concerns to avoid delays in care.   I discussed the assessment and treatment plan with the patient. The patient was provided an opportunity to ask questions and all  were answered. The patient agreed with the plan and demonstrated an understanding of the instructions.     Terressa Koyanagi, DO

## 2021-12-23 ENCOUNTER — Ambulatory Visit (INDEPENDENT_AMBULATORY_CARE_PROVIDER_SITE_OTHER): Payer: Medicare PPO | Admitting: Internal Medicine

## 2021-12-23 VITALS — BP 110/70 | HR 75 | Temp 97.8°F | Ht 69.0 in | Wt 179.0 lb

## 2021-12-23 DIAGNOSIS — Z125 Encounter for screening for malignant neoplasm of prostate: Secondary | ICD-10-CM

## 2021-12-23 DIAGNOSIS — Z136 Encounter for screening for cardiovascular disorders: Secondary | ICD-10-CM

## 2021-12-23 DIAGNOSIS — Z131 Encounter for screening for diabetes mellitus: Secondary | ICD-10-CM | POA: Diagnosis not present

## 2021-12-23 DIAGNOSIS — Z0000 Encounter for general adult medical examination without abnormal findings: Secondary | ICD-10-CM

## 2021-12-23 LAB — CBC WITH DIFFERENTIAL/PLATELET
Basophils Absolute: 0 10*3/uL (ref 0.0–0.1)
Basophils Relative: 0.7 % (ref 0.0–3.0)
Eosinophils Absolute: 0.1 10*3/uL (ref 0.0–0.7)
Eosinophils Relative: 1.7 % (ref 0.0–5.0)
HCT: 47.8 % (ref 39.0–52.0)
Hemoglobin: 16.5 g/dL (ref 13.0–17.0)
Lymphocytes Relative: 27.2 % (ref 12.0–46.0)
Lymphs Abs: 1.7 10*3/uL (ref 0.7–4.0)
MCHC: 34.6 g/dL (ref 30.0–36.0)
MCV: 90.8 fl (ref 78.0–100.0)
Monocytes Absolute: 0.7 10*3/uL (ref 0.1–1.0)
Monocytes Relative: 10.6 % (ref 3.0–12.0)
Neutro Abs: 3.7 10*3/uL (ref 1.4–7.7)
Neutrophils Relative %: 59.8 % (ref 43.0–77.0)
Platelets: 196 10*3/uL (ref 150.0–400.0)
RBC: 5.26 Mil/uL (ref 4.22–5.81)
RDW: 13.3 % (ref 11.5–15.5)
WBC: 6.2 10*3/uL (ref 4.0–10.5)

## 2021-12-23 LAB — COMPREHENSIVE METABOLIC PANEL
ALT: 18 U/L (ref 0–53)
AST: 18 U/L (ref 0–37)
Albumin: 4.5 g/dL (ref 3.5–5.2)
Alkaline Phosphatase: 50 U/L (ref 39–117)
BUN: 16 mg/dL (ref 6–23)
CO2: 27 mEq/L (ref 19–32)
Calcium: 9.7 mg/dL (ref 8.4–10.5)
Chloride: 103 mEq/L (ref 96–112)
Creatinine, Ser: 1.28 mg/dL (ref 0.40–1.50)
GFR: 53.57 mL/min — ABNORMAL LOW (ref >60.00)
Glucose, Bld: 101 mg/dL — ABNORMAL HIGH (ref 70–99)
Potassium: 5.1 mEq/L (ref 3.5–5.1)
Sodium: 141 mEq/L (ref 135–145)
Total Bilirubin: 0.7 mg/dL (ref 0.2–1.2)
Total Protein: 7.1 g/dL (ref 6.0–8.3)

## 2021-12-23 LAB — LIPID PANEL
Cholesterol: 220 mg/dL — ABNORMAL HIGH (ref 0–200)
HDL: 56.2 mg/dL (ref >39.00)
LDL Cholesterol: 148 mg/dL — ABNORMAL HIGH (ref 0–99)
NonHDL: 163.9
Total CHOL/HDL Ratio: 4
Triglycerides: 78 mg/dL (ref 0.0–149.0)
VLDL: 15.6 mg/dL (ref 0.0–40.0)

## 2021-12-23 LAB — TSH: TSH: 1.8 u[IU]/mL (ref 0.35–5.50)

## 2021-12-23 LAB — VITAMIN B12: Vitamin B-12: 569 pg/mL (ref 211–911)

## 2021-12-23 LAB — PSA: PSA: 4.75 ng/mL — ABNORMAL HIGH (ref 0.10–4.00)

## 2021-12-23 LAB — HEMOGLOBIN A1C: Hgb A1c MFr Bld: 5.2 % (ref 4.6–6.5)

## 2021-12-23 LAB — VITAMIN D 25 HYDROXY (VIT D DEFICIENCY, FRACTURES): VITD: 69.17 ng/mL (ref 30.00–100.00)

## 2021-12-23 NOTE — Progress Notes (Signed)
Established Patient Office Visit     CC/Reason for Visit: Annual preventive exam and subsequent Medicare wellness visit  HPI: Clifford Garcia is a 76 y.o. male who is coming in today for the above mentioned reasons. Past Medical History is significant for: Rosacea and macular degeneration.  He has no acute concerns or complaints.  He has routine eye and dental care.  He exercises routinely by performing tai chi and taekwondo.  He is overdue for bivalent COVID, flu, shingles and Tdap vaccines.   Past Medical/Surgical History: Past Medical History:  Diagnosis Date   Arthritis    Eczema    Environmental allergies    History of gout    X 1 EPISODE AGE 82   History of kidney stones     Past Surgical History:  Procedure Laterality Date   CATARACTS REMOVED     TOTAL HIP ARTHROPLASTY Right 10/18/2013   Procedure: RIGHT TOTAL HIP ARTHROPLASTY ANTERIOR APPROACH;  Surgeon: Mauri Pole, MD;  Location: [facility];  Service: Orthopedics;  Laterality: Right;    Social History:  reports that he quit smoking about 37 years ago. His smoking use included cigarettes. He has never used smokeless tobacco. He reports current alcohol use. He reports that he does not use drugs.  Allergies: Allergies  Allergen Reactions   Tetracycline Hcl Diarrhea    Bloated stomach    Tetracyclines & Related Diarrhea    Bloated stomach     Family History:  Family History  Problem Relation Age of Onset   CAD Father    Leukemia Brother      Current Outpatient Medications:    desonide (DESOWEN) 0.05 % cream, , Disp: , Rfl:   Review of Systems:  Constitutional: Denies fever, chills, diaphoresis, appetite change and fatigue.  HEENT: Denies photophobia, eye pain, redness, hearing loss, ear pain, congestion, sore throat, rhinorrhea, sneezing, mouth sores, trouble swallowing, neck pain, neck stiffness and tinnitus.   Respiratory: Denies SOB, DOE, cough, chest tightness,  and wheezing.   Cardiovascular:  Denies chest pain, palpitations and leg swelling.  Gastrointestinal: Denies nausea, vomiting, abdominal pain, diarrhea, constipation, blood in stool and abdominal distention.  Genitourinary: Denies dysuria, urgency, frequency, hematuria, flank pain and difficulty urinating.  Endocrine: Denies: hot or cold intolerance, sweats, changes in hair or nails, polyuria, polydipsia. Musculoskeletal: Denies myalgias, back pain, joint swelling, arthralgias and gait problem.  Skin: Denies pallor, rash and wound.  Neurological: Denies dizziness, seizures, syncope, weakness, light-headedness, numbness and headaches.  Hematological: Denies adenopathy. Easy bruising, personal or family bleeding history  Psychiatric/Behavioral: Denies suicidal ideation, mood changes, confusion, nervousness, sleep disturbance and agitation    Physical Exam: Vitals:   12/23/21 1001  BP: 110/70  Pulse: 75  Temp: 97.8 F (36.6 C)  TempSrc: Oral  SpO2: 98%  Weight: 179 lb (81.2 kg)  Height: '5\' 9"'  (1.753 m)    Body mass index is 26.43 kg/m.   Constitutional: NAD, calm, comfortable Eyes: PERRL, lids and conjunctivae normal ENMT: Mucous membranes are moist. Posterior pharynx clear of any exudate or lesions. Normal dentition. Tympanic membrane is pearly white, no erythema or bulging. Neck: normal, supple, no masses, no thyromegaly Respiratory: clear to auscultation bilaterally, no wheezing, no crackles. Normal respiratory effort. No accessory muscle use.  Cardiovascular: Regular rate and rhythm, no murmurs / rubs / gallops. No extremity edema. 2+ pedal pulses. No carotid bruits.  Abdomen: no tenderness, no masses palpated. No hepatosplenomegaly. Bowel sounds positive.  Musculoskeletal: no clubbing / cyanosis. No  joint deformity upper and lower extremities. Good ROM, no contractures. Normal muscle tone.  Skin: no rashes, lesions, ulcers. No induration Neurologic: CN 2-12 grossly intact. Sensation intact, DTR normal.  Strength 5/5 in all 4.  Psychiatric: Normal judgment and insight. Alert and oriented x 3. Normal mood.    Subsequent Medicare wellness visit   1. Risk factors, based on past  M,S,F -cardiovascular disease risk factors include age and gender only   2.  Physical activities: As above exercises 4 days a week   3.  Depression/mood: Stable, not depressed   4.  Hearing: Wears hearing aids   5.  ADL's: Independent in all ADLs   6.  Fall risk: Low fall risk   7.  Home safety: No problems identified   8.  Height weight, and visual acuity: height and weight as above, vision:  Vision Screening   Right eye Left eye Both eyes  Without correction '20/50 20/25 20/25 '  With correction        9.  Counseling: Advised to update immunization status   10. Lab orders based on risk factors: Laboratory update will be reviewed   11. Referral : None today   12. Care plan: Follow-up with me in 12 months   13. Cognitive assessment: No cognitive impairment   14. Screening: Patient provided with a written and personalized 5-10 year screening schedule in the AVS. yes   15. Provider List Update: PCP only  16. Advance Directives: Full code   17. Opioids: Patient is not on any opioid prescriptions and has no risk factors for a substance use disorder.   [place] Office Visit from 12/23/2021 in [facility]  PHQ-9 Total Score 0          05/25/2018   11:31 AM 05/21/2020    1:38 PM 09/14/2020    2:29 PM  [place] in the past year?   0  Was there an injury with Fall?   0  Fall Risk Category Calculator   0  Fall Risk Category   Low  Patient Fall Risk Level Low fall risk Low fall risk      Impression and Plan:  Encounter for preventive health examination - Plan: CBC with Differential/Platelet, Comprehensive metabolic panel, Hemoglobin A1c, Lipid panel, PSA, TSH, Vitamin B12, VITAMIN D 25 Hydroxy (Vit-D Deficiency, Fractures), VITAMIN D 25 Hydroxy (Vit-D  Deficiency, Fractures), Vitamin B12, TSH, PSA, Lipid panel, Hemoglobin A1c, Comprehensive metabolic panel, CBC with Differential/Platelet  -Recommend routine eye and dental care. -Immunizations: Advised to get bivalent COVID, flu, shingles, Tdap and consideration to RSV at pharmacy -Healthy lifestyle discussed in detail. -Labs to be updated today. -Colon cancer screening: 12/2018, 5-year follow-up -Breast cancer screening: Not applicable -Cervical cancer screening: Not applicable -Lung cancer screening: Not applicable -Prostate cancer screening: PSA today -DEXA: Not applicable    Kimarie Coor Isaac Bliss, MD [facility]

## 2021-12-24 ENCOUNTER — Encounter: Payer: Self-pay | Admitting: Internal Medicine

## 2021-12-24 DIAGNOSIS — R972 Elevated prostate specific antigen [PSA]: Secondary | ICD-10-CM | POA: Insufficient documentation

## 2021-12-24 DIAGNOSIS — E785 Hyperlipidemia, unspecified: Secondary | ICD-10-CM | POA: Insufficient documentation

## 2021-12-25 ENCOUNTER — Telehealth: Payer: Self-pay | Admitting: Internal Medicine

## 2021-12-25 NOTE — Telephone Encounter (Signed)
Pt called, returning CMA's call. CMA was unavailable. Pt asked that CMA call back at their earliest convenience. 

## 2021-12-26 NOTE — Telephone Encounter (Signed)
Pt is calling back and want you to give him a call back today.

## 2021-12-26 NOTE — Telephone Encounter (Signed)
Reviewed lab results with patient.

## 2022-01-20 ENCOUNTER — Telehealth: Payer: Self-pay | Admitting: Internal Medicine

## 2022-01-20 NOTE — Telephone Encounter (Signed)
Pt is calling and would like dr Jerilee Hoh to know he is working with health coach and is taking supplement glycoberine-mx to lower his chole. Please advise

## 2022-01-21 NOTE — Telephone Encounter (Signed)
Spoke with patient and gave him Dr Ledell Noss response.  Patient states he "has done a lot of investigation, and the medication with the new diet will help".

## 2022-04-12 ENCOUNTER — Encounter (HOSPITAL_COMMUNITY): Payer: Self-pay

## 2022-04-12 ENCOUNTER — Ambulatory Visit (HOSPITAL_COMMUNITY)
Admission: RE | Admit: 2022-04-12 | Discharge: 2022-04-12 | Disposition: A | Discharge status: Home / Self Care | Payer: Medicare PPO | Source: Ambulatory Visit | Attending: Physician Assistant | Admitting: Physician Assistant

## 2022-04-12 ENCOUNTER — Ambulatory Visit (INDEPENDENT_AMBULATORY_CARE_PROVIDER_SITE_OTHER): Payer: Medicare PPO

## 2022-04-12 VITALS — BP 134/80 | HR 94 | Temp 98.2°F | Resp 16

## 2022-04-12 DIAGNOSIS — M79661 Pain in right lower leg: Secondary | ICD-10-CM | POA: Diagnosis not present

## 2022-04-12 DIAGNOSIS — M79604 Pain in right leg: Secondary | ICD-10-CM | POA: Diagnosis not present

## 2022-04-12 DIAGNOSIS — J069 Acute upper respiratory infection, unspecified: Secondary | ICD-10-CM

## 2022-04-12 MED ORDER — BENZONATATE 100 MG PO CAPS: 100.0000 mg | ORAL_CAPSULE | Freq: Three times a day (TID) | ORAL | 0 refills | Status: DC

## 2022-04-12 MED ORDER — ALBUTEROL SULFATE HFA 108 (90 BASE) MCG/ACT IN AERS: 1.0000 | INHALATION_SPRAY | Freq: Four times a day (QID) | RESPIRATORY_TRACT | 0 refills | Status: DC | PRN

## 2022-04-12 NOTE — Discharge Instructions (Addendum)
Can continue with NyQuil. I have sent in [facility] for you to take every 8 hours as needed for cough. Can use inhaler as needed if you are experiencing shortness of breath or wheezing. Can also use Flonase and plain mucinex.  Recommend ice to swelling and bruising to the left lower extremity.  Imaging is negative.  No concern for DVT at this time.

## 2022-04-12 NOTE — ED Triage Notes (Signed)
Pt states cough and congestion for the past 4 days. Also state he fell last week and has  some swelling to his right calf.

## 2022-04-12 NOTE — ED Provider Notes (Signed)
[facility]    CSN: [id number] Arrival date & time: 04/12/22  1258      History   Chief Complaint Chief Complaint  Patient presents with   Influenza    Flu OR Bronchitis - Entered by patient    HPI Clifford Garcia is a 76 y.o. male.   Patient complains of cough and congestion that started 4 days ago.  He denies shortness of breath wheezing fever body aches.  He has been taking NyQuil with relief.  He also complains of right anterior leg pain and swelling after he reports falling and getting the leg on the table.  Reports mechanical.  No calf tenderness redness or swelling.  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 Authorizing Provider  albuterol (VENTOLIN HFA) 108 (90 Base) MCG/ACT inhaler Inhale 1-2 puffs into the lungs every 6 (six) hours as needed for wheezing or shortness of breath. 04/12/22  Yes Ward, Tylene Fantasia, PA-C  benzonatate (TESSALON) 100 MG capsule Take 1 capsule (100 mg total) by mouth every 8 (eight) hours. 04/12/22  Yes Ward, Tylene Fantasia, PA-C  desonide (DESOWEN) 0.05 % cream  08/25/15   [provider]    Family History Family History  Problem Relation Age of Onset   CAD Father    Leukemia Brother      Social History Social History   Tobacco Use   Smoking status: Former    Types: Cigarettes    Quit date: 10/13/1984    Years since quitting: 37.5   Smokeless tobacco: Never  Substance Use Topics   Alcohol use: Yes    Comment: OCCASIONAL   Drug use: Never     Allergies   Tetracycline hcl and Tetracyclines & related   Review of Systems Review of Systems  Constitutional:  Negative for chills and fever.  HENT:  Positive for congestion. Negative for ear pain and sore throat.   Eyes:  Negative for pain and visual disturbance.  Respiratory:  Positive for cough. Negative for shortness of breath.   Cardiovascular:  Negative for chest pain and palpitations.  Gastrointestinal:  Negative for abdominal pain and vomiting.  Genitourinary:  Negative for dysuria and hematuria.  Musculoskeletal:  Positive for arthralgias. Negative for back pain.  Skin:  Negative for color change and rash.  Neurological:  Negative for seizures and syncope.  All other systems reviewed and are negative.    Physical Exam Triage Vital Signs ED Triage Vitals  Enc Vitals Group     BP 04/12/22 1330 134/80     Pulse Rate 04/12/22 1330 94     Resp 04/12/22 1330 16  Temp 04/12/22 1330 98.2 F (36.8 C)     Temp Source 04/12/22 1330 Oral     SpO2 04/12/22 1330 94 %     Weight --      Height --      Head Circumference --      Peak Flow --      Pain Score 04/12/22 1332 0     Pain Loc --      Pain Edu? --      Excl. in GC? --    No data found.  Updated Vital Signs BP 134/80 (BP Location: Left Arm)   Pulse 94   Temp 98.2 F (36.8 C) (Oral)   Resp 16   SpO2 94%   Visual Acuity Right Eye Distance:   Left Eye Distance:   Bilateral Distance:    Right Eye Near:   Left Eye Near:    Bilateral Near:     Physical Exam Vitals and nursing note reviewed.  Constitutional:      General: He is not in acute distress.    Appearance: He is well-developed.  HENT:     Head: Normocephalic and  atraumatic.  Eyes:     Conjunctiva/sclera: Conjunctivae normal.  Cardiovascular:     Rate and Rhythm: Normal rate and regular rhythm.     Heart sounds: No murmur heard. Pulmonary:     Effort: Pulmonary effort is normal. No respiratory distress.     Breath sounds: Normal breath sounds.  Abdominal:     Palpations: Abdomen is soft.     Tenderness: There is no abdominal tenderness.  Musculoskeletal:        General: No swelling.     Cervical back: Neck supple.       Legs:  Skin:    General: Skin is warm and dry.     Capillary Refill: Capillary refill takes less than 2 seconds.  Neurological:     Mental Status: He is alert.  Psychiatric:        Mood and Affect: Mood normal.      UC Treatments / Results  Labs (all labs ordered are listed, but only abnormal results are displayed) Labs Reviewed - No data to display  EKG   Radiology DG Tibia/Fibula Right  Result Date: 04/12/2022 CLINICAL DATA:  Swelling in the right calf status post fall last week. EXAM: RIGHT TIBIA AND FIBULA - 2 VIEW COMPARISON:  None Available. FINDINGS: There is no evidence of acute fracture or other focal bone lesions. Small ossification at the inferior aspect of the medial malleolus. Vascular calcifications in the distal right lower leg. IMPRESSION: 1. No acute osseous abnormality. 2. Small ossification at the inferior aspect of the medial malleolus, likely sequela of remote injury. Electronically Signed   By: Sherron Ales M.D.   On: 04/12/2022 14:14    Procedures Procedures (including critical care time)  Medications Ordered in UC Medications - No data to display  Initial Impression / Assessment and Plan / UC Course  I have reviewed the triage vital signs and the nursing notes.  Pertinent labs & imaging results that were available during my care of the patient were reviewed by me and considered in my medical decision making (see chart for details).     Upper respiratory infection discussed  supportive care. Vitals within normal limits lungs clear, patient stable for discharge with supportive care.  Lower Leg injury,  imaging negative.  No concern for DVT.  Supportive care discussed Final Clinical Impressions(s) / UC Diagnoses  Final diagnoses:  Acute upper respiratory infection     Discharge Instructions      Can continue with NyQuil. I have sent in [facility] for you to take every 8 hours as needed for cough. Can use inhaler as needed if you are experiencing shortness of breath or wheezing. Can also use Flonase and plain mucinex.  Recommend ice to swelling and bruising to the left lower extremity.  Imaging is negative.  No concern for DVT at this time.     ED Prescriptions     Medication Sig Dispense Auth. Provider   benzonatate (TESSALON) 100 MG capsule Take 1 capsule (100 mg total) by mouth every 8 (eight) hours. 21 capsule Ward, Shanda Bumps Z, PA-C   albuterol (VENTOLIN HFA) 108 (90 Base) MCG/ACT inhaler Inhale 1-2 puffs into the lungs every 6 (six) hours as needed for wheezing or shortness of breath. 1 each Ward, Tylene Fantasia, PA-C      PDMP not reviewed this encounter.   Ward, Tylene Fantasia, PA-C 04/12/22 1421

## 2022-06-24 ENCOUNTER — Ambulatory Visit: Payer: Medicare PPO | Admitting: Internal Medicine

## 2022-06-24 ENCOUNTER — Encounter: Payer: Self-pay | Admitting: Internal Medicine

## 2022-06-24 VITALS — BP 110/78 | HR 84 | Temp 98.1°F | Wt 175.1 lb

## 2022-06-24 DIAGNOSIS — E782 Mixed hyperlipidemia: Secondary | ICD-10-CM

## 2022-06-24 LAB — LIPID PANEL
Cholesterol: 189 mg/dL (ref 0–200)
HDL: 55.9 mg/dL (ref >39.00)
LDL Cholesterol: 112 mg/dL — ABNORMAL HIGH (ref 0–99)
NonHDL: 133.35
Total CHOL/HDL Ratio: 3
Triglycerides: 109 mg/dL (ref 0.0–149.0)
VLDL: 21.8 mg/dL (ref 0.0–40.0)

## 2022-06-24 LAB — PSA: PSA: 275

## 2022-06-24 NOTE — Addendum Note (Signed)
Addended by: Erline Hau on: 06/24/2022 11:33 AM   Modules accepted: Level of Service

## 2022-06-24 NOTE — Progress Notes (Signed)
Established Patient Office Visit     CC/Reason for Visit: Follow-up cholesterol  HPI: Clifford Garcia is a 77 y.o. male who is coming in today for the above mentioned reasons. Past Medical History is significant for: Hyperlipidemia, Rohs ACN macular degeneration.  During his last physical in August he was found to have elevated cholesterol and elevated PSA.  In regards to the PSA he has been following with urology.  He has had 2 subsequent PSAs all which have been trending downward.  No procedures are planned for at this time.  He was also found to have hyperlipidemia, declined statin prescription.  Is due for recheck today.  Past Medical/Surgical History: Past Medical History:  Diagnosis Date   Arthritis    Eczema    Environmental allergies    History of gout    X 1 EPISODE AGE 27   History of kidney stones     Past Surgical History:  Procedure Laterality Date   CATARACTS REMOVED     TOTAL HIP ARTHROPLASTY Right 10/18/2013   Procedure: RIGHT TOTAL HIP ARTHROPLASTY ANTERIOR APPROACH;  Surgeon: Mauri Pole, MD;  Location: [facility];  Service: Orthopedics;  Laterality: Right;    Social History:  reports that he quit smoking about 37 years ago. His smoking use included cigarettes. He has never used smokeless tobacco. He reports current alcohol use. He reports that he does not use drugs.  Allergies: Allergies  Allergen Reactions   Tetracycline Hcl Diarrhea    Bloated stomach    Tetracyclines & Related Diarrhea    Bloated stomach     Family History:  Family History  Problem Relation Age of Onset   CAD Father    Leukemia Brother      Current Outpatient Medications:    desonide (DESOWEN) 0.05 % cream, , Disp: , Rfl:   Review of Systems:  Negative unless indicated in HPI.   Physical Exam: Vitals:   06/24/22 1103  BP: 110/78  Pulse: 84  Temp: 98.1 F (36.7 C)  TempSrc: Oral  SpO2: 100%  Weight: 175 lb 1.6 oz (79.4 kg)    Body mass index is 25.86  kg/m.   Physical Exam Vitals reviewed.  Constitutional:      Appearance: Normal appearance.  HENT:     Head: Normocephalic and atraumatic.  Eyes:     Conjunctiva/sclera: Conjunctivae normal.     Pupils: Pupils are equal, round, and reactive to light.  Cardiovascular:     Rate and Rhythm: Normal rate and regular rhythm.  Pulmonary:     Effort: Pulmonary effort is normal.     Breath sounds: Normal breath sounds.  Skin:    General: Skin is warm and dry.  Neurological:     General: No focal deficit present.     Mental Status: He is alert and oriented to person, place, and time.  Psychiatric:        Mood and Affect: Mood normal.        Behavior: Behavior normal.        Thought Content: Thought content normal.        Judgment: Judgment normal.      Impression and Plan:  Mixed hyperlipidemia - Plan: Lipid panel  -He decided not to go on atorvastatin that was recommended, has worked on lifestyle changes, check lipids today. -He has been following with urology for his elevated PSA.  He never had a biopsy or took antibiotics.  It has been coming down, at last  check was 2.7.  Time spent:30 minutes reviewing chart, interviewing and examining patient and formulating plan of care.     Lelon Frohlich, MD [facility]

## 2022-08-06 DIAGNOSIS — H353131 Nonexudative age-related macular degeneration, bilateral, early dry stage: Secondary | ICD-10-CM | POA: Diagnosis not present

## 2022-08-08 DIAGNOSIS — M9901 Segmental and somatic dysfunction of cervical region: Secondary | ICD-10-CM | POA: Diagnosis not present

## 2022-08-08 DIAGNOSIS — M9903 Segmental and somatic dysfunction of lumbar region: Secondary | ICD-10-CM | POA: Diagnosis not present

## 2022-08-08 DIAGNOSIS — M9902 Segmental and somatic dysfunction of thoracic region: Secondary | ICD-10-CM | POA: Diagnosis not present

## 2022-08-08 DIAGNOSIS — M5136 Other intervertebral disc degeneration, lumbar region: Secondary | ICD-10-CM | POA: Diagnosis not present

## 2022-08-11 DIAGNOSIS — L821 Other seborrheic keratosis: Secondary | ICD-10-CM | POA: Diagnosis not present

## 2022-08-11 DIAGNOSIS — L718 Other rosacea: Secondary | ICD-10-CM | POA: Diagnosis not present

## 2022-08-28 DIAGNOSIS — H903 Sensorineural hearing loss, bilateral: Secondary | ICD-10-CM | POA: Diagnosis not present

## 2022-09-12 DIAGNOSIS — M9902 Segmental and somatic dysfunction of thoracic region: Secondary | ICD-10-CM | POA: Diagnosis not present

## 2022-09-12 DIAGNOSIS — M5136 Other intervertebral disc degeneration, lumbar region: Secondary | ICD-10-CM | POA: Diagnosis not present

## 2022-09-12 DIAGNOSIS — M9903 Segmental and somatic dysfunction of lumbar region: Secondary | ICD-10-CM | POA: Diagnosis not present

## 2022-09-12 DIAGNOSIS — M9901 Segmental and somatic dysfunction of cervical region: Secondary | ICD-10-CM | POA: Diagnosis not present

## 2022-10-03 DIAGNOSIS — M9902 Segmental and somatic dysfunction of thoracic region: Secondary | ICD-10-CM | POA: Diagnosis not present

## 2022-10-03 DIAGNOSIS — M9903 Segmental and somatic dysfunction of lumbar region: Secondary | ICD-10-CM | POA: Diagnosis not present

## 2022-10-03 DIAGNOSIS — M9901 Segmental and somatic dysfunction of cervical region: Secondary | ICD-10-CM | POA: Diagnosis not present

## 2022-10-03 DIAGNOSIS — M5136 Other intervertebral disc degeneration, lumbar region: Secondary | ICD-10-CM | POA: Diagnosis not present

## 2022-11-07 DIAGNOSIS — M9903 Segmental and somatic dysfunction of lumbar region: Secondary | ICD-10-CM | POA: Diagnosis not present

## 2022-11-07 DIAGNOSIS — M5136 Other intervertebral disc degeneration, lumbar region: Secondary | ICD-10-CM | POA: Diagnosis not present

## 2022-11-07 DIAGNOSIS — M9902 Segmental and somatic dysfunction of thoracic region: Secondary | ICD-10-CM | POA: Diagnosis not present

## 2022-11-07 DIAGNOSIS — M9901 Segmental and somatic dysfunction of cervical region: Secondary | ICD-10-CM | POA: Diagnosis not present

## 2022-11-19 ENCOUNTER — Ambulatory Visit (HOSPITAL_COMMUNITY)
Admission: RE | Admit: 2022-11-19 | Discharge: 2022-11-19 | Disposition: A | Discharge status: Home / Self Care | Payer: Medicare PPO | Source: Ambulatory Visit | Attending: Family Medicine | Admitting: Family Medicine

## 2022-11-19 ENCOUNTER — Ambulatory Visit: Payer: Medicare PPO | Admitting: Family Medicine

## 2022-11-19 ENCOUNTER — Other Ambulatory Visit: Payer: Self-pay | Admitting: Family Medicine

## 2022-11-19 ENCOUNTER — Encounter: Payer: Self-pay | Admitting: Family Medicine

## 2022-11-19 VITALS — BP 120/70 | HR 80 | Temp 98.0°F | Ht 69.0 in | Wt 175.2 lb

## 2022-11-19 DIAGNOSIS — R1032 Left lower quadrant pain: Secondary | ICD-10-CM | POA: Insufficient documentation

## 2022-11-19 DIAGNOSIS — R932 Abnormal findings on diagnostic imaging of liver and biliary tract: Secondary | ICD-10-CM | POA: Diagnosis not present

## 2022-11-19 DIAGNOSIS — K5730 Diverticulosis of large intestine without perforation or abscess without bleeding: Secondary | ICD-10-CM | POA: Diagnosis not present

## 2022-11-19 DIAGNOSIS — R319 Hematuria, unspecified: Secondary | ICD-10-CM

## 2022-11-19 DIAGNOSIS — K449 Diaphragmatic hernia without obstruction or gangrene: Secondary | ICD-10-CM | POA: Diagnosis not present

## 2022-11-19 DIAGNOSIS — N132 Hydronephrosis with renal and ureteral calculous obstruction: Secondary | ICD-10-CM | POA: Diagnosis not present

## 2022-11-19 DIAGNOSIS — N200 Calculus of kidney: Secondary | ICD-10-CM

## 2022-11-19 LAB — POCT URINALYSIS DIPSTICK
Bilirubin, UA: NEGATIVE
Glucose, UA: NEGATIVE
Ketones, UA: NEGATIVE
Leukocytes, UA: NEGATIVE
Nitrite, UA: NEGATIVE
Protein, UA: NEGATIVE
Spec Grav, UA: 1.025 (ref 1.010–1.025)
Urobilinogen, UA: 0.2 E.U./dL
pH, UA: 6 (ref 5.0–8.0)

## 2022-11-19 MED ORDER — ONDANSETRON 4 MG PO TBDP
4.0000 mg | ORAL_TABLET | Freq: Three times a day (TID) | ORAL | 0 refills | Status: AC | PRN
Start: 2022-11-19 — End: 2022-11-24

## 2022-11-19 MED ORDER — CIPROFLOXACIN HCL 500 MG PO TABS
500.0000 mg | ORAL_TABLET | Freq: Two times a day (BID) | ORAL | 0 refills | Status: AC
Start: 2022-11-19 — End: 2022-11-26

## 2022-11-19 MED ORDER — TAMSULOSIN HCL 0.4 MG PO CAPS
0.4000 mg | ORAL_CAPSULE | Freq: Every day | ORAL | 0 refills | Status: DC
Start: 2022-11-19 — End: 2022-11-27

## 2022-11-19 MED ORDER — HYDROCODONE-ACETAMINOPHEN 5-325 MG PO TABS
1.0000 | ORAL_TABLET | Freq: Four times a day (QID) | ORAL | 0 refills | Status: AC | PRN
Start: 2022-11-19 — End: 2022-11-24

## 2022-11-19 NOTE — Patient Instructions (Signed)
-  Please report to [facility] Radiology Department for appointment at 4:30. -Will call you with a report as soon as possible.

## 2022-11-19 NOTE — Progress Notes (Signed)
Based on abnormal findings of CT kidney stone study from earlier today.

## 2022-11-19 NOTE — Progress Notes (Addendum)
Acute Office Visit   Subjective:  Patient ID: Clifford Garcia, male    DOB: [birth year], 77 y.o.   MRN: [id number]  Chief Complaint  Patient presents with   history stones    Pt reports he has hx of kidney stones. Pt reports he has been seen in [facility] Pt reports he has kidney stone , they have been keeping a eye on Pt reports last light after eating he started having burning pain and this is located on left side abdominal area. Pt reports no pain with urination    HPI Patient is a 77 year old male that presents left lower abd pain. Started this morning after having a bowel movement. Described as a constant and burning pain. Denies pain radiating. He denies flank or black pain. Denies pain when urinating or having bowel movement this morning. Denies increase urgency or frequency to urinate. Denies any visible blood in urine and stool this morning. Reports one dry heave, and any being nausea. Denies fever.   He reports he had three similar symptoms in early 2000s. He reports it was kidney stones. He is currently under the care of [facility]. He reports he has been seen in the last 3 months and told he has a little stone, unsure of which side, they are monitoring. Also, patient reports he has a history of diverticulosis.     ROS See HPI above     Objective:   BP 120/70   Pulse 80   Temp 98 F (36.7 C)   Ht 5\' 9"  (1.753 m)   Wt 175 lb 4 oz (79.5 kg)   SpO2 98%   BMI 25.88 kg/m    Physical Exam Vitals reviewed.  Constitutional:      General: He is not in acute distress.    Appearance: Normal appearance. He is not ill-appearing, toxic-appearing or diaphoretic.  HENT:     Head: Normocephalic and atraumatic.  Eyes:     General:        Right eye: No discharge.        Left eye: No discharge.     Conjunctiva/sclera: Conjunctivae normal.  Cardiovascular:     Rate and Rhythm: Normal rate and regular rhythm.     Heart sounds: Normal heart sounds. No murmur  heard.    No friction rub. No gallop.  Pulmonary:     Effort: Pulmonary effort is normal. No respiratory distress.     Breath sounds: Normal breath sounds.  Abdominal:     General: Bowel sounds are normal.     Palpations: There is no mass.     Tenderness: There is abdominal tenderness (Left quad tenderness, pin point area). There is no right CVA tenderness or left CVA tenderness.  Musculoskeletal:        General: Normal range of motion.  Skin:    General: Skin is warm and dry.  Neurological:     General: No focal deficit present.     Mental Status: He is alert and oriented to person, place, and time. Mental status is at baseline.  Psychiatric:        Mood and Affect: Mood normal.        Behavior: Behavior normal.        Thought Content: Thought content normal.        Judgment: Judgment normal.    Results for orders placed or performed in visit on 11/19/22  POCT urinalysis dipstick  Result Value Ref Range   Color, UA  DARK    Clarity, UA     Glucose, UA Negative Negative   Bilirubin, UA NEG    Ketones, UA NEG    Spec Grav, UA 1.025 1.010 - 1.025   Blood, UA 3+    pH, UA 6.0 5.0 - 8.0   Protein, UA Negative Negative   Urobilinogen, UA 0.2 0.2 or 1.0 E.U./dL   Nitrite, UA NEG    Leukocytes, UA Negative Negative   Appearance     Odor        Assessment & Plan:  Abdominal pain, unspecified abdominal location -     POCT urinalysis dipstick  1.Urinalysis has hematuria, +3. Will send urine for culture.  2.Ordered a STAT CT kidney stone study for suspecting kidney stone with patient having a history of kidney stones and having blood in urine. A little concerned this could be diverticulitis because he reports a history of diverticulosis. Awaiting CT to be scheduled.  3.Patient was scheduled for CT at [facility] Radiology Department for a scan at 4:30pm. Informed patient he would be called with results as soon as possible.   Zandra Abts, NP

## 2022-11-20 ENCOUNTER — Telehealth: Payer: Self-pay

## 2022-11-20 LAB — URINE CULTURE
MICRO NUMBER:: 15241259
SPECIMEN QUALITY:: ADEQUATE

## 2022-11-20 NOTE — Telephone Encounter (Signed)
-----   Message from Clifford Garcia sent at 11/19/2022  8:54 PM EDT ----- I have spoken with patient and provided him the information about scan. Also, I have already sent in the medication and he is going to be in touch if he can not get his medication.

## 2022-11-20 NOTE — Telephone Encounter (Signed)
Patient called to inform us that he picked up the medication that was sent in. Also wanted to let us know that he could not get in with the provider he wanted to at [facility] until September. They have him scheduled with a nurse practitioner on August 7th for now. Not what the patient truly wanted but he will take what he can get. Just wanted to update [place]

## 2022-11-20 NOTE — Telephone Encounter (Signed)
Noted .  Sent to Waymon Budge for [facility]

## 2022-11-25 ENCOUNTER — Telehealth: Payer: Self-pay

## 2022-11-25 NOTE — Telephone Encounter (Signed)
-----   Message from Zandra Abts sent at 11/25/2022  8:42 AM EDT ----- Your urine culture came back with less than 10,000 organisms. Therefore, you don't have a urinary tract infection. You do not have to complete taking the antibiotic unless you have seen urology and they advise differently. I hope you are feeling better.

## 2022-11-25 NOTE — Telephone Encounter (Signed)
Pt returned Tonya call. I gave him his Urine culture results and he states he sees Urology next week . He states he will continue the medication until then and feels fine .

## 2022-11-27 ENCOUNTER — Ambulatory Visit (HOSPITAL_COMMUNITY)
Admission: RE | Admit: 2022-11-27 | Discharge: 2022-11-27 | Disposition: A | Discharge status: Home / Self Care | Payer: Medicare PPO | Source: Ambulatory Visit | Attending: Family Medicine | Admitting: Family Medicine

## 2022-11-27 ENCOUNTER — Encounter (HOSPITAL_COMMUNITY): Payer: Self-pay

## 2022-11-27 VITALS — BP 118/71 | HR 76 | Temp 98.0°F | Resp 16 | Ht 69.0 in | Wt 175.3 lb

## 2022-11-27 DIAGNOSIS — R519 Headache, unspecified: Secondary | ICD-10-CM | POA: Diagnosis not present

## 2022-11-27 DIAGNOSIS — R42 Dizziness and giddiness: Secondary | ICD-10-CM | POA: Diagnosis not present

## 2022-11-27 LAB — POCT URINALYSIS DIP (MANUAL ENTRY)
Bilirubin, UA: NEGATIVE
Blood, UA: NEGATIVE
Glucose, UA: NEGATIVE mg/dL
Ketones, POC UA: NEGATIVE mg/dL
Leukocytes, UA: NEGATIVE
Nitrite, UA: NEGATIVE
Protein Ur, POC: NEGATIVE mg/dL
Spec Grav, UA: 1.025 (ref 1.010–1.025)
Urobilinogen, UA: 0.2 E.U./dL
pH, UA: 5 (ref 5.0–8.0)

## 2022-11-27 LAB — CBC
HCT: 46.1 % (ref 39.0–52.0)
Hemoglobin: 15.7 g/dL (ref 13.0–17.0)
MCH: 31.3 pg (ref 26.0–34.0)
MCHC: 34.1 g/dL (ref 30.0–36.0)
MCV: 91.8 fL (ref 80.0–100.0)
Platelets: 178 10*3/uL (ref 150–400)
RBC: 5.02 MIL/uL (ref 4.22–5.81)
RDW: 12.4 % (ref 11.5–15.5)
WBC: 6.2 10*3/uL (ref 4.0–10.5)
nRBC: 0 % (ref 0.0–0.2)

## 2022-11-27 LAB — COMPREHENSIVE METABOLIC PANEL
ALT: 20 U/L (ref 0–44)
AST: 23 U/L (ref 15–41)
Albumin: 3.7 g/dL (ref 3.5–5.0)
Alkaline Phosphatase: 43 U/L (ref 38–126)
Anion gap: 7 (ref 5–15)
BUN: 15 mg/dL (ref 8–23)
CO2: 26 mmol/L (ref 22–32)
Calcium: 9.2 mg/dL (ref 8.9–10.3)
Chloride: 105 mmol/L (ref 98–111)
Creatinine, Ser: 1.16 mg/dL (ref 0.61–1.24)
GFR, Estimated: >60 mL/min (ref >60)
Glucose, Bld: 91 mg/dL (ref 70–99)
Potassium: 4.1 mmol/L (ref 3.5–5.1)
Sodium: 138 mmol/L (ref 135–145)
Total Bilirubin: 0.8 mg/dL (ref 0.3–1.2)
Total Protein: 6.9 g/dL (ref 6.5–8.1)

## 2022-11-27 LAB — TSH: TSH: 1.911 u[IU]/mL (ref 0.350–4.500)

## 2022-11-27 LAB — POCT FASTING CBG KUC MANUAL ENTRY: POCT Glucose (KUC): 99 mg/dL (ref 70–99)

## 2022-11-27 NOTE — ED Triage Notes (Signed)
Dizziness; Headache; light headed and off balance when going from sitting to standing. No pain anywhere, no diet changes, no medication changes. Recently treated for kidney stones.   Onset of symptoms 3 weeks.

## 2022-11-27 NOTE — ED Provider Notes (Signed)
[facility]   [id number] 11/27/22 Arrival Time: 1345  ASSESSMENT & PLAN:  1. Orthostatic lightheadedness    Unclear etiology. Normal neurologic and cardiac exam.  ECG: NSR. No acute changes.  Results for orders placed or performed during the hospital encounter of 11/27/22  POC CBG monitoring  Result Value Ref Range   POCT Glucose (KUC) 99 70 - 99 mg/dL  POC urinalysis dipstick  Result Value Ref Range   Color, UA light yellow (A) yellow   Clarity, UA clear clear   Glucose, UA negative negative mg/dL   Bilirubin, UA negative negative   Ketones, POC UA negative negative mg/dL   Spec Grav, UA 4.132 4.401 - 1.025   Blood, UA negative negative   pH, UA 5.0 5.0 - 8.0   Protein Ur, POC negative negative mg/dL   Urobilinogen, UA 0.2 0.2 or 1.0 E.U./dL   Nitrite, UA Negative Negative   Leukocytes, UA Negative Negative   Orders Placed This Encounter  Pending   CBC   Comprehensive metabolic panel   TSH   Recommend:  Follow-up Information     Schedule an appointment as soon as possible for a visit  with Philip Aspen, Limmie Patricia, MD.   Specialty: Internal Medicine Contact information: [address] [place] [address] [phone]         [facility] Department at [facility].   Specialty: Emergency Medicine Why: If symptoms worsen in any way. Contact information: [address] [place] [address] [phone]               Reviewed expectations re: course of current medical issues. Questions answered. Outlined signs and symptoms indicating need for more acute intervention. Patient verbalized understanding. After Visit Summary given.   SUBJECTIVE:  Clifford Garcia is a 77 y.o. male who reports intermittent "lightheadedness"; first noted approx 3 weeks ago; worse in the morning when going from sitting to standing position. Denies assoc CP/SOB/n/v/diaphoresis. Otherwise well. Denies vertigo but  h/o vertigo in past. Episodes of lightheadedness last no more than a couple of minutes when present. Denies HA/visual changes. Denies coordination problems, gait problems, memory problems, paresthesia, seizures, speech problems, tremors, and weakness as well as otalgia and tinnitus. Recent infections: none. Head trauma: denied. No associated SOB, CP, or palpatations reported. Recent travel: none. Reports normal bowel/bladder habits. Kidney stone last week; feeling better.  Social History   Substance and Sexual Activity  Alcohol Use Yes   Comment: OCCASIONAL   Social History   Tobacco Use  Smoking Status Former   Current packs/day: 0.00   Types: Cigarettes   Quit date: 10/13/1984   Years since quitting: 38.1  Smokeless Tobacco Never    OBJECTIVE:  Vitals:   11/27/22 1449 11/27/22 1450  BP:  118/71  Pulse:  76  Resp:  16  Temp:  98 F (36.7 C)  TempSrc:  Oral  SpO2:  96%  Weight: 79.5 kg   Height: 5\' 9"  (1.753 m)     General appearance: alert; no distress Eyes: PERRLA; EOMI; conjunctiva normal HENT: normocephalic; atraumatic Neck: supple with FROM Lungs: clear to auscultation bilaterally Heart: regular rate and rhythm Extremities: no cyanosis or edema; symmetrical with no gross deformities Skin: warm and dry Neurologic: normal gait; CN 2-12 grossly intact Psychological: alert and cooperative; normal mood and affect  Investigations: Results for orders placed or performed during the hospital encounter of 11/27/22  POC CBG monitoring  Result Value Ref Range   POCT  Glucose (KUC) 99 70 - 99 mg/dL  POC urinalysis dipstick  Result Value Ref Range   Color, UA light yellow (A) yellow   Clarity, UA clear clear   Glucose, UA negative negative mg/dL   Bilirubin, UA negative negative   Ketones, POC UA negative negative mg/dL   Spec Grav, UA 0.981 1.914 - 1.025   Blood, UA negative negative   pH, UA 5.0 5.0 - 8.0   Protein Ur, POC negative negative mg/dL   Urobilinogen,  UA 0.2 0.2 or 1.0 E.U./dL   Nitrite, UA Negative Negative   Leukocytes, UA Negative Negative   No results found.  Allergies  Allergen Reactions   Tetracycline Hcl Diarrhea    Bloated stomach    Tetracyclines & Related Diarrhea    Bloated stomach     Past Medical History:  Diagnosis Date   Arthritis    Eczema    Environmental allergies    History of gout    X 1 EPISODE AGE 51   History of kidney stones    Social History   Socioeconomic History   Marital status: Significant Other    Spouse name: Not on file   Number of children: Not on file   Years of education: Not on file   Highest education level: Doctorate  Occupational History   Not on file  Tobacco Use   Smoking status: Former    Current packs/day: 0.00    Types: Cigarettes    Quit date: 10/13/1984    Years since quitting: 38.1   Smokeless tobacco: Never  Substance and Sexual Activity   Alcohol use: Yes    Comment: OCCASIONAL   Drug use: Never   Sexual activity: Not on file  Other Topics Concern   Not on file  Social History Narrative   Not on file   Social Determinants of Health   Financial Resource Strain: Low Risk  (06/20/2022)   Overall Financial Resource Strain (CARDIA)    Difficulty of Paying Living Expenses: Not hard at all  Food Insecurity: No Food Insecurity (06/20/2022)   Hunger Vital Sign    Worried About Running Out of Food in the Last Year: Never true    Ran Out of Food in the Last Year: Never true  Transportation Needs: No Transportation Needs (06/20/2022)   PRAPARE - Administrator, Civil Service (Medical): No    Lack of Transportation (Non-Medical): No  Physical Activity: Sufficiently Active (06/20/2022)   Exercise Vital Sign    Days of Exercise per Week: 3 days    Minutes of Exercise per Session: 60 min  Stress: No Stress Concern Present (06/20/2022)   [employer] of Occupational Health - Occupational Stress Questionnaire    Feeling of Stress : Only a little  Social  Connections: Unknown (06/20/2022)   Social Connection and Isolation Panel [NHANES]    Frequency of Communication with Friends and Family: More than three times a week    Frequency of Social Gatherings with Friends and Family: Once a week    Attends Religious Services: Patient declined    Database administrator or Organizations: No    Attends Engineer, structural: Not on file    Marital Status: Living with partner  Intimate Partner Violence: Not on file   Family History  Problem Relation Age of Onset   CAD Father    Leukemia Brother    Past Surgical History:  Procedure Laterality Date   CATARACTS REMOVED     TOTAL  HIP ARTHROPLASTY 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

## 2022-11-27 NOTE — Discharge Instructions (Signed)
You have had labs (blood tests) sent today. We will call you with any significant abnormalities or if there is need to begin or change treatment or pursue further follow up.  You may also review your test results online through MyChart. If you do not have a MyChart account, instructions to sign up should be on your discharge paperwork.  

## 2022-11-28 DIAGNOSIS — M9902 Segmental and somatic dysfunction of thoracic region: Secondary | ICD-10-CM | POA: Diagnosis not present

## 2022-11-28 DIAGNOSIS — M5136 Other intervertebral disc degeneration, lumbar region: Secondary | ICD-10-CM | POA: Diagnosis not present

## 2022-11-28 DIAGNOSIS — M9901 Segmental and somatic dysfunction of cervical region: Secondary | ICD-10-CM | POA: Diagnosis not present

## 2022-11-28 DIAGNOSIS — M9903 Segmental and somatic dysfunction of lumbar region: Secondary | ICD-10-CM | POA: Diagnosis not present

## 2022-12-03 DIAGNOSIS — N132 Hydronephrosis with renal and ureteral calculous obstruction: Secondary | ICD-10-CM | POA: Diagnosis not present

## 2022-12-09 ENCOUNTER — Encounter: Payer: Self-pay | Admitting: Family Medicine

## 2022-12-09 ENCOUNTER — Ambulatory Visit: Payer: Medicare PPO | Admitting: Family Medicine

## 2022-12-09 ENCOUNTER — Telehealth: Payer: Self-pay

## 2022-12-09 VITALS — Temp 97.8°F | Ht 69.0 in | Wt 174.1 lb

## 2022-12-09 DIAGNOSIS — R519 Headache, unspecified: Secondary | ICD-10-CM

## 2022-12-09 DIAGNOSIS — H6121 Impacted cerumen, right ear: Secondary | ICD-10-CM | POA: Diagnosis not present

## 2022-12-09 DIAGNOSIS — R42 Dizziness and giddiness: Secondary | ICD-10-CM | POA: Diagnosis not present

## 2022-12-09 MED ORDER — MECLIZINE HCL 12.5 MG PO TABS
12.5000 mg | ORAL_TABLET | Freq: Three times a day (TID) | ORAL | 0 refills | Status: DC | PRN
Start: 2022-12-09 — End: 2022-12-30

## 2022-12-09 NOTE — Progress Notes (Addendum)
New Patient Office Visit  Subjective    Patient ID: Clifford Garcia, male    DOB: Dec 02, 1945  Age: 77 y.o. MRN: [id number]  CC:  Chief Complaint  Patient presents with   Establish Care    Pt is here today to [facility]. Pt reports has urlolgy appt. Dr..Wrenn 01/26/2023 ([facility]) Pt reports dizziness daily- sx started 09/2022 Pt is not FASTING    HPI Clifford Garcia presents to establish care with new provider.   Patients previous primary care provider was Dr. Henderson Cloud with [facility] [place].  Last visit was 06/24/2022.   Specialist: [facility]-Dr. Vevelyn Royals, next appointment 01/26/2023 [facility] -Dr. Randell Patient Pardue  Chiropractor-Dr. Nathanial Rancher  [facility]-Dr. Durene Romans  GI-unknown; due next year due to polyps removed  [facility] with Dr. Fredrich Garcia  Dermatology-[facility] with Dr. Donzetta Garcia  Dentist-Dr. Priscella Mann   Patient reports he is experiencing dizziness. Symptoms on June 20th. Occurs daily, mostly in the morning. Dizziness-present when waking up and most early afternoon. Dizziness is less in the late afternoon and evening. Also, complains of a light pressure in his temples. Occurs mostly in the morning and early afternoon. Decreases, functions better in the late evening and at night. He reports he has been able to read with no complications. Denies blurry vision, chest pain, or shortness of breath. Denies ear pain or drainage.  History of vertigo in 2019, seen [facility] with Clifford Hacker PA. Based on note, history suggestive of benign paroxysmal positional vertigo. His Dix-Hallpike testing was negative for positional vertigo.   Patient was seen by [facility] with Dr. Hillis Range on 11/27/2022 for orthostatic lightheadedness. Unclear etiology found. CBC, CMP, and TSH were all stable. EKG was  NSR with no acute changes.   Outpatient Encounter Medications as of 12/09/2022  Medication Sig   meclizine (ANTIVERT) 12.5 MG tablet Take 1 tablet (12.5 mg total) by mouth 3 (three) times daily as needed for dizziness.   desonide (DESOWEN) 0.05 % cream    No facility-administered encounter medications on file as of 12/09/2022.    Past Medical History:  Diagnosis Date   Arthritis    Eczema    Environmental allergies    History of gout    X 1 EPISODE AGE 23   History of kidney stones    Macular degeneration disease     Past Surgical History:  Procedure Laterality Date   CATARACTS REMOVED     TOTAL HIP ARTHROPLASTY Right 10/18/2013   Procedure: RIGHT TOTAL HIP ARTHROPLASTY ANTERIOR APPROACH;  Surgeon: Shelda Pal, MD;  Location: [facility];  Service: Orthopedics;  Laterality: Right;    Family History  Problem Relation Age of Onset   CAD Father    Spina bifida Sister    Leukemia Brother    Dementia Brother    Healthy Daughter     Social History   Socioeconomic History   Marital status: Significant Other    Spouse name: Not on file   Number of children: 2   Years of education: Not on file   Highest education level: Doctorate  Occupational History   Not on file  Tobacco Use   Smoking status: Former    Current packs/day: 0.00    Types: Cigarettes    Quit date: 10/13/1984    Years since quitting: 38.1   Smokeless tobacco: Never  Vaping Use  Vaping status: Never Used  Substance and Sexual Activity   Alcohol use: Yes    Comment: White wine 1x month   Drug use: Never   Sexual activity: Yes  Other Topics Concern   Not on file  Social History Narrative   Not on file   Social Determinants of Health   Financial Resource Strain: Low Risk  (06/20/2022)   Overall Financial Resource Strain (CARDIA)    Difficulty of Paying Living Expenses: Not hard at all  Food Insecurity: No Food Insecurity (06/20/2022)   Hunger Vital Sign    Worried About Running Out of Food in the  Last Year: Never true    Ran Out of Food in the Last Year: Never true  Transportation Needs: No Transportation Needs (06/20/2022)   PRAPARE - Administrator, Civil Service (Medical): No    Lack of Transportation (Non-Medical): No  Physical Activity: Sufficiently Active (06/20/2022)   Exercise Vital Sign    Days of Exercise per Week: 3 days    Minutes of Exercise per Session: 60 min  Stress: No Stress Concern Present (06/20/2022)   [employer] of Occupational Health - Occupational Stress Questionnaire    Feeling of Stress : Only a little  Social Connections: Moderately Integrated (12/09/2022)   Social Connection and Isolation Panel [NHANES]    Frequency of Communication with Friends and Family: More than three times a week    Frequency of Social Gatherings with Friends and Family: Once a week    Attends Religious Services: Never    Database administrator or Organizations: Yes    Attends Banker Meetings: 1 to 4 times per year    Marital Status: Living with partner  Intimate Partner Violence: Not At Risk (12/09/2022)   Humiliation, Afraid, Rape, and Kick questionnaire    Fear of Current or Ex-Partner: No    Emotionally Abused: No    Physically Abused: No    Sexually Abused: No    ROS See HPI above    Objective    Temp 97.8 F (36.6 C)   Ht 5\' 9"  (1.753 m)   Wt 174 lb 2 oz (79 kg)   SpO2 98%   BMI 25.71 kg/m   Physical Exam Vitals reviewed.  Constitutional:      General: He is not in acute distress.    Appearance: Normal appearance. He is not ill-appearing, toxic-appearing or diaphoretic.  HENT:     Head: Normocephalic and atraumatic.     Right Ear: There is impacted cerumen.     Left Ear: Tympanic membrane, ear canal and external ear normal. There is no impacted cerumen.     Ears:     Comments: Usually wears hearing aid in right ear Eyes:     General:        Right eye: No discharge.        Left eye: No discharge.     Extraocular  Movements:     Right eye: No nystagmus.     Left eye: No nystagmus.     Conjunctiva/sclera: Conjunctivae normal.  Cardiovascular:     Rate and Rhythm: Normal rate and regular rhythm.     Heart sounds: Normal heart sounds. No murmur heard.    No friction rub. No gallop.  Pulmonary:     Effort: Pulmonary effort is normal. No respiratory distress.     Breath sounds: Normal breath sounds.  Musculoskeletal:        General: Normal range of motion.  Right lower leg: No edema.     Left lower leg: No edema.  Skin:    General: Skin is warm and dry.  Neurological:     General: No focal deficit present.     Mental Status: He is alert and oriented to person, place, and time. Mental status is at baseline.     Cranial Nerves: Cranial nerves 2-12 are intact.     Motor: No weakness.     Gait: Gait normal.  Psychiatric:        Mood and Affect: Mood normal.        Behavior: Behavior normal.        Thought Content: Thought content normal.        Judgment: Judgment normal.   PRE-PROCEDURE EXAM:  Right TM cannot be visualized due to partial occlusion/impaction of the ear canal. PROCEDURE INDICATION: Remove wax to visualize ear drum & relieve discomfort CONSENT:  Verbalto  PROCEDURE NOTE: Left and Right ear: The CMA, Baruch Gouty, irrigated right ear with warm water and ear drops to remove the wax. 100% of the wax was removed.  POST- PROCEDURE EXAM: Right TM successfully visualized. TM with no erythema. The patient tolerated the procedure well.    EKG completed for dizziness. NSR with no ST elevation. No significant changes from previous EKG that was completed at the ED on 11/27/2022.   Assessment & Plan:  Hearing loss of right ear due to cerumen impaction -     Ear Lavage  Dizziness -     CT HEAD WO CONTRAST ( ); Future -     Meclizine HCl; Take 1 tablet (12.5 mg total) by mouth 3 (three) times daily as needed for dizziness.  Dispense: 15 tablet; Refill: 0  Nonintractable headache,  unspecified chronicity pattern, unspecified headache type -     CT HEAD WO CONTRAST ( ); Future  1.Ear lavage completed by CMA on right ear for impaction. Successful.  2.Ordered CT head scan without contrast for dizziness and pressure feeling at his temples.  3. Prescribed Meclizine 12.5mg  every 8 hours as needed for dizziness to try to see if this helps.  4.Follow up in 3 months for dizziness. Hopefully, CT scan will be completed by that. Depending on CT results, may need to send to neurology or to physical therapy for further evaluation of dizziness. Also, verbalized to patient that he may want to make an appointment with Dr. Lorin Picket, ophthalmologist to make sure this is not stemming from macular degeneration.  5. Orthostatics completed with no concern.  Return in about 3 weeks (around 12/30/2022).   Zandra Abts, NP

## 2022-12-09 NOTE — Patient Instructions (Signed)
-  It was a pleasure to care for you today. -Ordered CT headscan without contrast for dizziness and headache. If you do hear about an appointment in two weeks or a message on MyChart. Call back to office.  -Prescribed Meclizine for dizziness to try.  -Right ear lavage performed. -Follow up in 3 weeks.

## 2022-12-09 NOTE — Addendum Note (Signed)
Addended by: Alveria Apley on: 12/09/2022 04:35 PM   Modules accepted: Orders

## 2022-12-09 NOTE — Telephone Encounter (Signed)
error 

## 2022-12-11 ENCOUNTER — Telehealth: Payer: Self-pay | Admitting: Family Medicine

## 2022-12-11 DIAGNOSIS — R42 Dizziness and giddiness: Secondary | ICD-10-CM | POA: Diagnosis not present

## 2022-12-11 NOTE — Telephone Encounter (Signed)
Sent this to [place].

## 2022-12-11 NOTE — Telephone Encounter (Signed)
Noted thanks °

## 2022-12-11 NOTE — Telephone Encounter (Signed)
Kylie-  from [facility] CT scan dept called stating pt needs Pre-certification before he can get his CT scan.   Phone: [phone]

## 2022-12-11 NOTE — Telephone Encounter (Signed)
Left vm to call office

## 2022-12-14 ENCOUNTER — Ambulatory Visit (HOSPITAL_BASED_OUTPATIENT_CLINIC_OR_DEPARTMENT_OTHER)
Admission: RE | Admit: 2022-12-14 | Discharge: 2022-12-14 | Disposition: A | Discharge status: Home / Self Care | Payer: Medicare PPO | Source: Ambulatory Visit | Attending: Family Medicine | Admitting: Family Medicine

## 2022-12-14 DIAGNOSIS — R519 Headache, unspecified: Secondary | ICD-10-CM | POA: Diagnosis not present

## 2022-12-14 DIAGNOSIS — R42 Dizziness and giddiness: Secondary | ICD-10-CM | POA: Insufficient documentation

## 2022-12-19 ENCOUNTER — Other Ambulatory Visit: Payer: Self-pay

## 2022-12-19 ENCOUNTER — Telehealth: Payer: Self-pay

## 2022-12-19 DIAGNOSIS — R519 Headache, unspecified: Secondary | ICD-10-CM

## 2022-12-19 DIAGNOSIS — M9901 Segmental and somatic dysfunction of cervical region: Secondary | ICD-10-CM | POA: Diagnosis not present

## 2022-12-19 DIAGNOSIS — M9903 Segmental and somatic dysfunction of lumbar region: Secondary | ICD-10-CM | POA: Diagnosis not present

## 2022-12-19 DIAGNOSIS — R42 Dizziness and giddiness: Secondary | ICD-10-CM

## 2022-12-19 DIAGNOSIS — M5136 Other intervertebral disc degeneration, lumbar region: Secondary | ICD-10-CM | POA: Diagnosis not present

## 2022-12-19 DIAGNOSIS — M9902 Segmental and somatic dysfunction of thoracic region: Secondary | ICD-10-CM | POA: Diagnosis not present

## 2022-12-19 NOTE — Telephone Encounter (Signed)
Pt seen results Via my chart  I have placed referral to Neurology and I left pt a VM stating the referral was placed

## 2022-12-19 NOTE — Telephone Encounter (Signed)
-----   Message from Zandra Abts sent at 12/19/2022  2:45 PM EDT ----- Your CT scan shows no acute intracranial abnormality. No specific reason found to explain headaches. 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 With having dizziness and experiencing light pressures in his temples, I would recommend a neurology consult for dizziness and head pain. (Referral to neurology for dx of dizziness, and headache)

## 2022-12-22 ENCOUNTER — Encounter: Payer: Self-pay | Admitting: Neurology

## 2022-12-26 ENCOUNTER — Other Ambulatory Visit: Payer: Self-pay | Admitting: Family Medicine

## 2022-12-26 ENCOUNTER — Ambulatory Visit: Payer: Medicare PPO | Admitting: Family Medicine

## 2022-12-26 DIAGNOSIS — R42 Dizziness and giddiness: Secondary | ICD-10-CM

## 2022-12-26 DIAGNOSIS — Z09 Encounter for follow-up examination after completed treatment for conditions other than malignant neoplasm: Secondary | ICD-10-CM

## 2022-12-30 ENCOUNTER — Other Ambulatory Visit: Payer: Self-pay

## 2022-12-30 ENCOUNTER — Telehealth: Payer: Self-pay | Admitting: Family Medicine

## 2022-12-30 DIAGNOSIS — R42 Dizziness and giddiness: Secondary | ICD-10-CM

## 2022-12-30 MED ORDER — MECLIZINE HCL 12.5 MG PO TABS: 12.5000 mg | ORAL_TABLET | Freq: Three times a day (TID) | ORAL | 0 refills | Status: DC | PRN

## 2022-12-30 NOTE — Telephone Encounter (Signed)
Encourage patient to contact the pharmacy for refills or they can request refills through [facility]   WHAT PHARMACY WOULD THEY LIKE THIS SENT TO:  [id number] - [place], [place] - [address]   MEDICATION NAME & DOSE: meclizine (ANTIVERT) 12.5 MG tablet   NOTES/COMMENTS FROM PATIENT: Pt states medication was refused    Front office please notify patient: It takes 48-72 hours to process rx refill requests Ask patient to call pharmacy to ensure rx is ready before heading there.

## 2022-12-30 NOTE — Telephone Encounter (Signed)
Pt is aware.  

## 2022-12-30 NOTE — Telephone Encounter (Signed)
Refilled Meclizine 12.5 Last refill 12/09/2022

## 2022-12-31 ENCOUNTER — Encounter: Payer: Medicare PPO | Admitting: Internal Medicine

## 2023-01-05 ENCOUNTER — Encounter: Payer: Medicare PPO | Admitting: Internal Medicine

## 2023-01-13 ENCOUNTER — Ambulatory Visit: Payer: Medicare PPO | Admitting: Neurology

## 2023-01-13 ENCOUNTER — Other Ambulatory Visit (INDEPENDENT_AMBULATORY_CARE_PROVIDER_SITE_OTHER): Payer: Medicare PPO

## 2023-01-13 ENCOUNTER — Encounter: Payer: Self-pay | Admitting: Neurology

## 2023-01-13 VITALS — BP 124/62 | HR 85 | Ht 69.0 in | Wt 179.4 lb

## 2023-01-13 DIAGNOSIS — G629 Polyneuropathy, unspecified: Secondary | ICD-10-CM

## 2023-01-13 DIAGNOSIS — R42 Dizziness and giddiness: Secondary | ICD-10-CM | POA: Diagnosis not present

## 2023-01-13 LAB — SEDIMENTATION RATE: Sed Rate: 10 mm/h (ref 0–20)

## 2023-01-13 LAB — C-REACTIVE PROTEIN: CRP: <1 mg/dL (ref 0.5–20.0)

## 2023-01-13 LAB — VITAMIN B12: Vitamin B-12: 473 pg/mL (ref 211–911)

## 2023-01-13 LAB — TSH: TSH: 1.09 u[IU]/mL (ref 0.35–5.50)

## 2023-01-13 NOTE — Progress Notes (Signed)
NEUROLOGY CONSULTATION NOTE  CAROLINE NORMILE MRN: [id number] DOB: 1945/04/30  Referring provider: Zandra Abts, NP Primary care provider: Zandra Abts, NP  Reason for consult:  dizziness   Thank you for your kind referral of Clifford Garcia for consultation of the above symptoms. Although his history is well known to you, please allow me to reiterate it for the purpose of our medical record. He is alone in the office today. Records and images were personally reviewed where available.   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 He recalls having an episode 4 years ago where he felt dizzy/lightheaded when he woke up but still went to Taekwondo. He was doing an exercise using a stick, turned and lost his balance, falling and fracturing a rib. The dizziness quieted down until July 2024 when again at Taekwondo while doing their warmup, he felt lightheaded and had to stop. He has not been back since then. There is a positional component to his symptoms. He is fine when supine. When he does the downward dog position in Yoga, he feels the lightheaded sensation. His instructor put blocks to support his neck and he was able to do the posture without symptoms. Another time it occurs is when he was doing a 360 degree spin in Taekwondo. He does not have any problems when doing Tai Chi. He recently went on a yoga trip to [place] and after being on a bus going down a narrow winding road, he was off balance. He found that sitting at the front with his feet planted on the ground helped. When he wakes up in the morning, he puts both feet on the ground before standing and does fine 90% of the time. He was given a prescription for Meclizine and notes he seemed to have less symptoms when taking it but not an unbelievable difference. He denies any spinning sensation. No headaches, double  vision, difficulty swallowing, nausea/vomiting, neck/back pain, focal numbness/tingling/weakness, bowel/bladder dysfunction. 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  I personally reviewed head CT without contrast done 11/2022 which did not show any acute changes. 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      PAST MEDICAL HISTORY: Past Medical History:  Diagnosis Date   Arthritis    Eczema    Environmental allergies    History of gout    X 1 EPISODE AGE 44   History of kidney stones    Macular degeneration disease     PAST SURGICAL HISTORY: Past Surgical History:  Procedure Laterality Date   CATARACTS REMOVED     TOTAL HIP ARTHROPLASTY Right 10/18/2013   Procedure: RIGHT TOTAL HIP ARTHROPLASTY ANTERIOR APPROACH;  Surgeon: Shelda Pal, MD;  Location: [facility];  Service: Orthopedics;  Laterality: Right;    MEDICATIONS: Current Outpatient Medications on File Prior to Visit  Medication Sig Dispense Refill   desonide (DESOWEN) 0.05 % cream      meclizine (ANTIVERT) 12.5 MG tablet Take 1 tablet (12.5 mg total) by mouth 3 (three) times daily as needed for dizziness. 15 tablet 0   No current facility-administered medications on file prior to visit.    ALLERGIES: Allergies  Allergen Reactions   Tetracycline Hcl Diarrhea    Bloated stomach    Tetracyclines & Related Diarrhea    Bloated stomach  FAMILY HISTORY: Family History  Problem Relation Age of Onset   CAD Father    Spina bifida Sister    Leukemia Brother    Dementia Brother    Healthy Daughter     SOCIAL HISTORY: Social History   Socioeconomic History   Marital status: Significant Other    Spouse name: Not on file   Number of children: 2   Years of education: Not on file   Highest education level: Doctorate  Occupational History   Not on file  Tobacco Use   Smoking status: Former     Current packs/day: 0.00    Types: Cigarettes    Quit date: 10/13/1984    Years since quitting: 38.2   Smokeless tobacco: Never  Vaping Use   Vaping status: Never Used  Substance and Sexual Activity   Alcohol use: Yes    Comment: White wine 1x week   Drug use: Never   Sexual activity: Yes  Other Topics Concern   Not on file  Social History Narrative   Are you right handed or left handed? right   Are you currently employed ? 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 Partner    What type of home do you live in: 1 story or 2 story? 2 story 15 steps       Social Determinants of Health   Financial Resource Strain: Low Risk  (06/20/2022)   Overall Financial Resource Strain (CARDIA)    Difficulty of Paying Living Expenses: Not hard at all  Food Insecurity: No Food Insecurity (06/20/2022)   Hunger Vital Sign    Worried About Running Out of Food in the Last Year: Never true    Ran Out of Food in the Last Year: Never true  Transportation Needs: No Transportation Needs (06/20/2022)   PRAPARE - Administrator, Civil Service (Medical): No    Lack of Transportation (Non-Medical): No  Physical Activity: Sufficiently Active (06/20/2022)   Exercise Vital Sign    Days of Exercise per Week: 3 days    Minutes of Exercise per Session: 60 min  Stress: No Stress Concern Present (06/20/2022)   [employer] of Occupational Health - Occupational Stress Questionnaire    Feeling of Stress : Only a little  Social Connections: Moderately Integrated (12/09/2022)   Social Connection and Isolation Panel [NHANES]    Frequency of Communication with Friends and Family: More than three times a week    Frequency of Social Gatherings with Friends and Family: Once a week    Attends Religious Services: Never    Database administrator or Organizations: Yes    Attends Banker Meetings: 1 to 4 times  per year    Marital Status: Living with partner  Intimate Partner Violence: Not At Risk (12/09/2022)   Humiliation, Afraid, Rape, and Kick questionnaire    Fear of Current or Ex-Partner: No    Emotionally Abused: No    Physically Abused: No    Sexually Abused: No     PHYSICAL EXAM: Vitals:   01/13/23 0845 01/13/23 0910  BP: 131/72 132/66  Pulse: 84 73  SpO2: 98% 98%   Orthostatic VS for the past 72 hrs (Last 3 readings):  Patient Position  01/13/23 0912 Standing  BP 124/62   HR 85  01/13/23 0911 Sitting      BP 128/76  HR 79  01/13/23 0910 Prone       BP  132/66  HR 73      General: No acute distress Head:  Normocephalic/atraumatic Skin/Extremities: No rash, no edema Neurological Exam: Mental status: alert and awake, no dysarthria or aphasia, Fund of knowledge is appropriate.  Attention and concentration are normal.   Cranial nerves: CN I: not tested CN II: pupils equal, round, visual fields intact CN III, IV, VI:  full range of motion, no nystagmus, no ptosis CN V: facial sensation intact CN VII: upper and lower face symmetric CN VIII: hearing intact to conversation CN XI: sternocleidomastoid and trapezius muscles intact CN XII: tongue midline Bulk & Tone: normal, no fasciculations. Motor: 5/5 throughout with no pronator drift. Sensation: intact to light touch, cold, pin on both UE. Decreased temperature to both ankles, decreased vibration sense to knees bilaterally. Intact pin on both LE.  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 He denies any spinning sensation but notes a clear positional  component with a sensation of lightheadedness with certain changes in position. No signficant orthostasis in office today. His neurological exam shows a length-dependent neuropathy which can cause balance changes, however etiology of positional dizziness unclear. Possibly vestibular, however we will do vascular imaging to rule out vertebrobasilar insufficiency. He will be referred for Vestibular therapy. Neuropathy labs will be ordered. All his questions were answered to the best of my abilities. Follow-up in 3-4 months, call for any changes.   Thank you for allowing me to participate in the care of this patient. Please do not hesitate to call for any questions or concerns.   Patrcia Dolly, M.D.  CC: Zandra Abts, NP

## 2023-01-13 NOTE — Patient Instructions (Addendum)
Good to meet you!  Have bloodwork done for TSH, B12, B1, ESR, CRP  2. Referral will be sent for Vestibular therapy  3. Schedule MRA head without contrast, MRA neck with and without contrast  4. Follow-up in 3-4 months, call for any changes

## 2023-01-15 ENCOUNTER — Encounter: Payer: Medicare PPO | Admitting: Internal Medicine

## 2023-01-16 ENCOUNTER — Telehealth: Payer: Self-pay

## 2023-01-16 DIAGNOSIS — M9903 Segmental and somatic dysfunction of lumbar region: Secondary | ICD-10-CM | POA: Diagnosis not present

## 2023-01-16 DIAGNOSIS — M5136 Other intervertebral disc degeneration, lumbar region: Secondary | ICD-10-CM | POA: Diagnosis not present

## 2023-01-16 DIAGNOSIS — M9902 Segmental and somatic dysfunction of thoracic region: Secondary | ICD-10-CM | POA: Diagnosis not present

## 2023-01-16 DIAGNOSIS — M9901 Segmental and somatic dysfunction of cervical region: Secondary | ICD-10-CM | POA: Diagnosis not present

## 2023-01-16 LAB — VITAMIN B1: Vitamin B1 (Thiamine): 14 nmol/L (ref 8–30)

## 2023-01-16 NOTE — Telephone Encounter (Signed)
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

## 2023-01-16 NOTE — Telephone Encounter (Signed)
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

## 2023-01-19 ENCOUNTER — Ambulatory Visit: Payer: Medicare PPO | Admitting: Family Medicine

## 2023-01-19 ENCOUNTER — Encounter: Payer: Self-pay | Admitting: Family Medicine

## 2023-01-19 VITALS — BP 108/64 | HR 85 | Temp 98.4°F | Ht 69.0 in | Wt 178.2 lb

## 2023-01-19 DIAGNOSIS — R42 Dizziness and giddiness: Secondary | ICD-10-CM | POA: Diagnosis not present

## 2023-01-19 DIAGNOSIS — R519 Headache, unspecified: Secondary | ICD-10-CM | POA: Diagnosis not present

## 2023-01-19 NOTE — Patient Instructions (Signed)
-  It was good to see you today.  -Dizziness is improving and recommend to follow through with the recommendations of neurology.  -Follow up in 6 months for a physical. Recommend to schedule an Annual Wellness Exam with [facility] on the telephone for Medicare.  -May get covid booster at your local pharmacy. May get influenza vaccine at your local pharmacy or a nurse visit with primary care office.

## 2023-01-19 NOTE — Therapy (Signed)
OUTPATIENT PHYSICAL THERAPY VESTIBULAR EVALUATION     Patient Name: Clifford Garcia MRN: [id number] DOB:[birth year], 77 y.o., male Today's Date: 01/21/2023  END OF SESSION:  PT End of Session - 01/21/23 1327     Visit Number 1    Number of Visits 13    Date for PT Re-Evaluation 03/04/23    Authorization Type [employer]    Authorization Time Period auth submitted    PT Start Time 1211    PT Stop Time 1325    PT Time Calculation (min) 74 min    Equipment Utilized During Treatment Gait belt    Activity Tolerance Patient tolerated treatment well    Behavior During Therapy WFL for tasks assessed/performed             Past Medical History:  Diagnosis Date   Arthritis    Eczema    Environmental allergies    History of gout    X 1 EPISODE AGE 27   History of kidney stones    Macular degeneration disease    Past Surgical History:  Procedure Laterality Date   CATARACTS REMOVED     TOTAL HIP ARTHROPLASTY Right 10/18/2013   Procedure: RIGHT TOTAL HIP ARTHROPLASTY ANTERIOR APPROACH;  Surgeon: Shelda Pal, MD;  Location: [facility];  Service: Orthopedics;  Laterality: Right;   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 That day he was doing taekwondo and got dizzy, falling and fx'd a rib. On July 16th, 2024 he was doing some exercises, stood  up and got very dizzy. Stopped doing taekwondo d/t dizziness. Reports significant dizziness with bending such as with downward dog, turns, riding in a car on winding roads. Episodes are described as imbalance, lightheadedness and last 30-40 sec. Denies head trauma, infection/illness, vision changes/double vision, hearing loss, migraines, tinnitus.   Pt accompanied by: self  PERTINENT HISTORY: Macular degeneration, THA 2015  PAIN:  Are you having pain? No  PRECAUTIONS: None  RED FLAGS: None   WEIGHT BEARING RESTRICTIONS: No  FALLS: Has patient fallen in last 6 months? No  LIVING ENVIRONMENT: Lives with: lives with their spouse Lives in: House/apartment Stairs:  2 story house; 2 steps to enter   PLOF: Independent- semi-retired: works from [employer], sometimes travels. Likes yoga, tai-chi, taekwondo  PATIENT GOALS: improve dizziness   OBJECTIVE:   DIAGNOSTIC FINDINGS: 12/14/22 head CT:  No acute intracranial abnormality. No specific findings to explain headaches. 2. Moderate chronic small-vessel disease.  Orthostatics negative at neurology visit 01/13/23  COGNITION: Overall cognitive status: Within functional limits for tasks assessed   SENSATION: Pt reports intact sensation in B UEs/LEs   POSTURE:  rounded shoulders and forward head  Cervical ROM:    Active A/PROM (deg) eval  Flexion   Extension   Right lateral flexion   Left lateral flexion   Right rotation   Left rotation   (Blank rows =  not tested)   GAIT: Gait pattern: WFL Assistive device utilized: None Level of assistance: Complete Independence   PATIENT SURVEYS:  FOTO 48, 58  VESTIBULAR ASSESSMENT:  GENERAL OBSERVATION: pt wears reading glasses and distance glasses for driving    OCULOMOTOR EXAM:  Ocular Alignment: normal  Ocular ROM:  slightly reduced R eye superior ROM  Spontaneous Nystagmus: absent  Gaze-Induced Nystagmus: absent  Smooth Pursuits: intact  Saccades:  intact  Convergence/Divergence: 2 cm    VESTIBULAR - OCULAR REFLEX:   Slow VOR: Normal; c/o delayed slight dizziness   VOR Cancellation: Normal; c/o delayed slight dizziness   Head-Impulse Test: HIT Right: positive HIT Left: positive *c/o delayed dizziness       POSITIONAL TESTING:  Right Roll Test: negative Left Roll Test: negative  Right Sidelying: negative; c/o dizziness upon sitting up Left Sidelying: negative; c/o dizziness upon sitting up     M-CTSIB  Condition 1: Firm Surface, EO 30  Sec, Mild Sway  Condition 2: Firm Surface, EC 30 Sec, Moderate Sway  Condition 3: Foam Surface, EO 30 Sec, Mild Sway  Condition 4: Foam Surface, EC 5 Sec, Severe Sway     VESTIBULAR TREATMENT:                                                                                                   DATE: 01/21/23    PATIENT EDUCATION: Education details: prognosis, POC, HEP, edu and 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 Hypofunction, vestibular rehab and recovery time, function of VOR, intended level of sx with HEP Person educated: Patient Education method: Explanation, Demonstration, Tactile cues, Verbal cues, and Handouts Education comprehension: verbalized understanding and returned demonstration  HOME EXERCISE PROGRAM: Access Code: [email or web address] URL: [email or web address] Date: 01/21/2023 Prepared by: [facility] - Outpatient  Rehab - [facility]  Exercises - Seated Gaze Stabilization with Head Rotation  - 1 x daily - 5 x weekly - 2-3 sets - 30 sec hold - Seated Gaze Stabilization with Head Nod  - 1 x daily - 5 x weekly - 2-3 sets - 30 sec hold  GOALS: Goals reviewed with patient? Yes  SHORT TERM GOALS: Target date: 02/11/2023  Patient to be independent with initial HEP. Baseline: HEP initiated Goal status: INITIAL    LONG TERM GOALS: Target date: 03/04/2023  Patient to be independent with advanced HEP. Baseline: Not yet  initiated  Goal status: INITIAL  Patient to report 0/10 dizziness with standing vertical and horizontal VOR for 30 seconds. Baseline: Unable Goal status: INITIAL  Patient will report 0/10 dizziness with bed mobility.  Baseline: Symptomatic  Goal status: INITIAL  Patient to maintain balance for M-CTSIB condition with eyes closed/foam surface for 20 sec in order to improve safety in environments with uneven surfaces and dim lighting. Baseline: 5 sec Goal status: INITIAL  Patient to score at least 24/30 on FGA in order to decrease risk of falls. Baseline: NT Goal status: INITIAL  Patient to return to yoga and tai chi without dizziness limiting.  Baseline: reports dizziness Goal status: INITIAL  Patient to score at least  58 on FOTO in order to indicate improved functional outcomes.  Baseline: 48 Goal status: INITIAL    ASSESSMENT:  CLINICAL IMPRESSION:   Patient is a 77 y/o M presenting to OPPT with c/o worsening dizziness for the past 2 months.Episodes are described as imbalance, lightheadedness and last 30-40 sec. Worse with bending such as with downward dog, turns, riding in a car on winding roads. Denies head trauma, infection/illness, vision changes/double vision, hearing loss, migraines, tinnitus.Patient today presented with slightly reduced R eye superior ROM, delayed c/o dizziness with VOR, VOR cancellation, and HIT, positive B HIT, motion sensitivity, and decreased use of vestibular system with multisensory balance testing. Patient was educated on VOR HEP and reported understanding. Would benefit from skilled PT services 1-2x/week for 6 weeks to address aforementioned impairments in order to optimize level of function.    OBJECTIVE IMPAIRMENTS: decreased activity tolerance, decreased balance, and dizziness.   ACTIVITY LIMITATIONS: lifting, bending, standing, squatting, stairs, transfers, bed mobility, bathing, toileting, dressing, reach over head, and  hygiene/grooming  PARTICIPATION LIMITATIONS: meal prep, cleaning, laundry, driving, shopping, community activity, occupation, yard work, and church  PERSONAL FACTORS: Age, Fitness, Past/current experiences, Time since onset of injury/illness/exacerbation, and 3+ comorbidities: Macular degeneration, THA 2015  are also affecting patient's functional outcome.   REHAB POTENTIAL: Good  CLINICAL DECISION MAKING: Evolving/moderate complexity  EVALUATION COMPLEXITY: Moderate   PLAN:  PT FREQUENCY: 1-2x/week  PT DURATION: 6 weeks  PLANNED INTERVENTIONS: Therapeutic exercises, Therapeutic activity, Neuromuscular re-education, Balance training, Gait training, Patient/Family education, Self Care, Joint mobilization, Stair training, Vestibular training, Canalith repositioning, Dry Needling, Electrical stimulation, Cryotherapy, Moist heat, Taping, Manual therapy, and Re-evaluation  PLAN FOR NEXT SESSION: FGA, review HEP, progress VOR and work on habituating turns, bending    Referring diagnosis? R42 (ICD-10-CM) - Positional lightheadedness Treatment diagnosis? (if different than referring diagnosis) Dizziness and giddiness, Unsteadiness on feet What was this (referring dx) caused by? []  Surgery []  Fall []  Ongoing issue []  Arthritis [x]  Other: __insidious__________  Laterality: []  Rt []  Lt [x]  Both  Check all possible CPT codes:  *CHOOSE 10 OR LESS*    []  16109 (Therapeutic Exercise)  []  92507 (SLP Treatment)  []  97112 (Neuro Re-ed)   []  92526 (Swallowing Treatment)   []  97116 (Gait Training)   []  [id number] (Cognitive Training, 1st 15 minutes) []  97140 (Manual Therapy)   []  97130 (Cognitive Training, each add'l 15 minutes)  []  97164 (Re-evaluation)                              []  Other, List CPT Code ____________  []  97530 (Therapeutic Activities)     []  97535 (Self Care)   [x]  All codes above (97110 - 97535)  []  97012 (Mechanical Traction)  [x]  97014 (E-stim Unattended)  []  97032  (E-stim manual)  []  97033 (Ionto)  []  97035 (Ultrasound) []  97750 (Physical Performance Training) []  [id number] (Aquatic Therapy) []  97016 (Vasopneumatic Device) []  97018 (Paraffin) []  97034 (Contrast Bath) []  97597 (Wound Care 1st 20 sq cm) []  97598 (Wound Care each add'l 20 sq cm) []  97760 (Orthotic Fabrication, Fitting, Training Initial) []  [id number] (Prosthetic Management and Training Initial) []  [id number] (Orthotic or Prosthetic Training/ Modification Subsequent)    Anette Guarneri, PT, DPT 01/21/23 1:40 PM  [facility] [address], [address] [place], [place] [address] Phone # [phone] Fax # [phone]

## 2023-01-19 NOTE — Progress Notes (Signed)
Established Patient Office Visit   Subjective:  Patient ID: Clifford Garcia, male    DOB: 04/12/1946  Age: 77 y.o. MRN: [id number]  Chief Complaint  Patient presents with   Follow-up    Pt reports he seen Neuro and he only has dizziness and no headaches. Pt reports crystals in his ears. Pt reports he was dizzy while doing Yoga and when riding in backseat. Pt reports he has no other concerns today. Pt reports Neuro would like to do MRA .     HPI Patient is following up for complaints of dizziness and headache at last visit on 12/09/2022. Patient had a CT head scan that showed no acute intracranial abnormalities and reason for headaches. Recommended a neurology referral since he was experiencing dizziness and light pressure in his temples. Patient has followed up with [facility], Dr. Lysle Rubens. Plan of care was to complete neuropathy labs (TSH, B12, B1, ESR, and CRP), referral to vestibular therapy, complete a MRA head without contrast and MRA neck with and without contrast. He has a follow up scheduled for 04/15/2023. His labs came back normal and awaiting imaging. He reports he is scheduled to start therapy on Wednesday.   Patient reports his dizziness has improved. He has only had dizziness with yoga and when riding in the back seat while in [place]. He reports has improved his dizziness in yoga by placing two blocks underneath his chin to look out instead of downward while doing the downward dog pose for yoga. He reports he has only used a few of the Meclizine for dizziness. Also, reports his temple pressure has improved.   ROS See HPI above     Objective:   BP 108/64   Pulse 85   Temp 98.4 F (36.9 C)   Ht 5\' 9"  (1.753 m)   Wt 178 lb 4 oz (80.9 kg)   SpO2 96%   BMI 26.32 kg/m     Physical Exam Vitals reviewed.  Constitutional:      General: He is not in acute distress.    Appearance: Normal appearance. He is not ill-appearing, toxic-appearing or diaphoretic.   HENT:     Head: Normocephalic and atraumatic.  Eyes:     General:        Right eye: No discharge.        Left eye: No discharge.     Conjunctiva/sclera: Conjunctivae normal.  Cardiovascular:     Rate and Rhythm: Normal rate and regular rhythm.     Heart sounds: Normal heart sounds. No murmur heard.    No friction rub. No gallop.  Pulmonary:     Effort: Pulmonary effort is normal. No respiratory distress.     Breath sounds: Normal breath sounds.  Musculoskeletal:        General: Normal range of motion.  Skin:    General: Skin is warm and dry.  Neurological:     General: No focal deficit present.     Mental Status: He is alert and oriented to person, place, and time. Mental status is at baseline.  Psychiatric:        Mood and Affect: Mood normal.        Behavior: Behavior normal.        Thought Content: Thought content normal.        Judgment: Judgment normal.      Assessment & Plan:  There are no diagnoses linked to this encounter. 1.Review health maintenance:  -Covid boosters: Will obtain at his  local pharmacy -Influenza vaccine: Will wait til October, either obtain at local pharmacy or at primary care  office.  -Needs AWV: will ask Archie Patten, CMA to call patient to schedule with health coach.  2.Dizziness is improving and temple pressure has improved. Encourage he follow up with neurology and there treatment plan. Discussed in length about having an MRA of head and neck with and without contrast.   Return in about 6 months (around 07/19/2023) for physical: [facility] .   Zandra Abts, NP

## 2023-01-21 ENCOUNTER — Other Ambulatory Visit: Payer: Self-pay

## 2023-01-21 ENCOUNTER — Encounter: Payer: Self-pay | Admitting: Physical Therapy

## 2023-01-21 ENCOUNTER — Ambulatory Visit: Payer: Medicare PPO | Attending: Neurology | Admitting: Physical Therapy

## 2023-01-21 DIAGNOSIS — R2681 Unsteadiness on feet: Secondary | ICD-10-CM | POA: Insufficient documentation

## 2023-01-21 DIAGNOSIS — R42 Dizziness and giddiness: Secondary | ICD-10-CM | POA: Diagnosis not present

## 2023-01-23 NOTE — Therapy (Addendum)
OUTPATIENT PHYSICAL THERAPY VESTIBULAR TREATMENT     Patient Name: Clifford Garcia MRN: [id number] DOB:13-Mar-1946, 77 y.o., male Today's Date: 01/27/2023  END OF SESSION:  PT End of Session - 01/26/23 1612     Visit Number 2    Number of Visits 13    Date for PT Re-Evaluation 03/04/23    Authorization Type [employer]    Authorization Time Period approved 13 PT visits from 01/21/2023 - 03/04/2023    Authorization - Visit Number 2    Authorization - Number of Visits 13    PT Start Time 1534    PT Stop Time 1612    PT Time Calculation (min) 38 min    Equipment Utilized During Treatment Gait belt    Activity Tolerance Patient tolerated treatment well    Behavior During Therapy WFL for tasks assessed/performed              Past Medical History:  Diagnosis Date   Arthritis    Eczema    Environmental allergies    History of gout    X 1 EPISODE AGE 69   History of kidney stones    Macular degeneration disease    Past Surgical History:  Procedure Laterality Date   CATARACTS REMOVED     TOTAL HIP ARTHROPLASTY Right 10/18/2013   Procedure: RIGHT TOTAL HIP ARTHROPLASTY ANTERIOR APPROACH;  Surgeon: Shelda Pal, MD;  Location: [facility];  Service: Orthopedics;  Laterality: Right;   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 Feels much better since that time. Feels more energy when he walks now.    Pt accompanied by: self  PERTINENT HISTORY: Macular degeneration, THA 2015  PAIN:  Are you having pain? No  PRECAUTIONS: None  RED FLAGS: None   WEIGHT BEARING RESTRICTIONS: No  FALLS: Has patient fallen in last 6 months? No  LIVING ENVIRONMENT: Lives with: lives with their spouse Lives in: House/apartment Stairs:  2 story house; 2 steps to enter   PLOF: Independent- semi-retired: works from [employer], sometimes travels. Likes yoga, tai-chi, taekwondo  PATIENT GOALS: improve dizziness   OBJECTIVE:     TODAY'S TREATMENT: 01/26/23 Activity Comments  FGA 25/30  review of HEP: sitting horizontal VOR 2x30" sitting vertical VOR 2x30" Cueing to reduce ROM, avoid stopping at midline; more difficulty maintaining gaze to L  3/10 dizziness horizontal 6/10 dizziness horizontal  standing head turns/nods to targets 2x30" each Compensatory blinking to reduce stimulation; 2-3/10 dizziness horizontal; 6/10 dizziness vertical; cues to slow down to decrease symptoms        [facility] PT Assessment - 01/26/23 0001       Functional Gait  Assessment   Gait assessed  Yes    Gait Level Surface Walks 20 ft in less than 5.5 sec, no assistive devices, good speed, no evidence for imbalance, normal gait  pattern, deviates no more than 6 in outside of the 12 in walkway width.    Change in Gait Speed Able to smoothly change walking speed without loss of balance or gait deviation. Deviate no more than 6 in outside of the 12 in walkway width.    Gait with Horizontal Head Turns Performs head turns smoothly with no change in gait. Deviates no more than 6 in outside 12 in walkway width   c/o mild dizziness   Gait with Vertical Head Turns Performs head turns with no change in gait. Deviates no more than 6 in outside 12 in walkway width.   mild dizziness   Gait and Pivot Turn Pivot turns  safely within 3 sec and stops quickly with no loss of balance.   c/o mild dizziness   Step Over Obstacle Is able to step over 2 stacked shoe boxes taped together (9 in total height) without changing gait speed. No evidence of imbalance.    Gait with Narrow Base of Support Ambulates less than 4 steps heel to toe or cannot perform without assistance.    Gait with Eyes Closed Walks 20 ft, uses assistive device, slower speed, mild gait deviations, deviates 6-10 in outside 12 in walkway width. Ambulates 20 ft in less than 9 sec but greater than 7 sec.    Ambulating Backwards Walks 20 ft, no assistive devices, good speed, no evidence for imbalance, normal gait    Steps Alternating feet, must use rail.    Total Score 25             HOME EXERCISE PROGRAM Last updated: 01/26/23 Access Code: [email or web address] URL: [email or web address] Date: 01/26/2023 Prepared by: [facility] - Outpatient  Rehab - [facility]  Exercises - Seated Gaze Stabilization with Head Rotation  - 1 x daily - 5 x weekly - 2-3 sets - 30 sec hold - Seated Gaze Stabilization with Head Nod  - 1 x daily - 5 x weekly - 2-3 sets - 30 sec hold - Standing with Head Rotation  - 1 x daily - 5 x weekly - 2-3 sets - 30 sec hold - Standing with Head Nod  - 1 x daily - 5 x weekly - 2-3 sets - 20 sec hold   PATIENT EDUCATION: Education details: HEP update, edu on fluctuations in sx with vestibular rehab, edu on objective testing  Person educated: Patient Education method: Explanation, Demonstration, Tactile cues, Verbal cues, and Handouts Education comprehension: verbalized understanding and returned demonstration    Below measures were taken at time of initial evaluation unless otherwise specified:   DIAGNOSTIC FINDINGS: 12/14/22 head CT:  No acute intracranial abnormality. No specific findings to explain headaches. 2. Moderate chronic small-vessel disease.  Orthostatics negative at neurology visit  01/13/23  COGNITION: Overall cognitive status: Within functional limits for tasks assessed   SENSATION: Pt reports intact sensation in B UEs/LEs   POSTURE:  rounded shoulders and forward head  Cervical ROM:    Active A/PROM (deg) eval  Flexion   Extension   Right lateral flexion   Left lateral flexion   Right rotation   Left rotation   (Blank rows = not tested)   GAIT: Gait pattern: WFL Assistive device utilized: None Level of assistance: Complete Independence   PATIENT SURVEYS:  FOTO 48, 58  VESTIBULAR ASSESSMENT:  GENERAL OBSERVATION: pt wears reading glasses and distance glasses for driving    OCULOMOTOR EXAM:  Ocular Alignment: normal  Ocular ROM:  slightly reduced R eye superior ROM  Spontaneous Nystagmus: absent  Gaze-Induced Nystagmus: absent  Smooth Pursuits: intact  Saccades: intact  Convergence/Divergence: 2 cm    VESTIBULAR - OCULAR REFLEX:   Slow VOR: Normal; c/o delayed slight dizziness   VOR Cancellation: Normal; c/o delayed slight dizziness   Head-Impulse Test: HIT Right: positive HIT Left: positive *c/o delayed dizziness       POSITIONAL TESTING:  Right Roll Test: negative Left Roll Test: negative  Right Sidelying: negative; c/o dizziness upon sitting up Left Sidelying: negative; c/o dizziness upon sitting up     M-CTSIB  Condition 1: Firm Surface, EO 30  Sec, Mild Sway  Condition 2: Firm Surface, EC 30 Sec, Moderate Sway  Condition 3: Foam Surface, EO 30 Sec, Mild Sway  Condition 4: Foam Surface, EC 5 Sec, Severe Sway     VESTIBULAR TREATMENT:                                                                                                   DATE: 01/21/23    PATIENT EDUCATION: Education details: prognosis, POC, HEP, edu and 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 Hypofunction, vestibular rehab and recovery time, function of VOR, intended level of sx with HEP Person educated:  Patient Education method: Explanation, Demonstration, Tactile cues, Verbal cues, and Handouts Education comprehension: verbalized understanding and returned demonstration  HOME EXERCISE PROGRAM: Access Code: [email or web address] URL: [email or web address] Date: 01/21/2023 Prepared by: [facility] - Outpatient  Rehab - [facility]  Exercises - Seated Gaze Stabilization with Head Rotation  - 1 x daily - 5 x weekly - 2-3 sets - 30 sec hold - Seated Gaze Stabilization with Head Nod  - 1 x daily - 5 x weekly - 2-3 sets - 30 sec hold  GOALS: Goals reviewed with patient? Yes  SHORT TERM GOALS: Target date: 02/11/2023  Patient to be independent with initial HEP. Baseline: HEP initiated Goal status: IN PROGRESS    LONG TERM GOALS: Target date: 03/04/2023  Patient to be independent with advanced HEP. Baseline: Not yet initiated  Goal status: IN PROGRESS  Patient to report 0/10 dizziness with standing vertical and horizontal VOR for 30 seconds. Baseline: Unable Goal status: IN PROGRESS  Patient will report 0/10 dizziness with bed mobility.  Baseline: Symptomatic  Goal status: IN PROGRESS  Patient to maintain balance for M-CTSIB condition with eyes closed/foam surface for 20 sec in order to improve safety in environments with uneven surfaces and dim lighting. Baseline: 5 sec Goal status: IN PROGRESS  Patient to score at least 24/30 on FGA in order to decrease risk of falls. Baseline: 25/30 01/26/24 Goal status: MET  01/26/24  Patient to return to yoga and tai chi without dizziness limiting.  Baseline: reports dizziness Goal status: IN PROGRESS  Patient to score at least 58 on FOTO in order to indicate improved functional outcomes.  Baseline: 48 Goal status: IN PROGRESS    ASSESSMENT:  CLINICAL IMPRESSION: Patient arrived to session with report of improvement since evaluation. Patient scored 25/30 on FGA, indicating decreased risk of falls. Most difficulty evident with  EC, narrow BOS.  Reviewed VOR exercises from HEP and provided cueing for proper performance. Patient appears more symptomatic with vertical head nods, requiring cues to slow down to maintain symptoms under control. HEP was updated with standing gaze stabilization. Patient reported understanding and without complaints upon leaving.   OBJECTIVE IMPAIRMENTS: decreased activity tolerance, decreased balance, and dizziness.   ACTIVITY LIMITATIONS: lifting, bending, standing, squatting, stairs, transfers, bed mobility, bathing, toileting, dressing, reach over head, and hygiene/grooming  PARTICIPATION LIMITATIONS: meal prep, cleaning, laundry, driving, shopping, community activity, occupation, yard work, and church  PERSONAL FACTORS: Age, Fitness, Past/current experiences, Time since onset of injury/illness/exacerbation, and 3+ comorbidities: Macular degeneration, THA 2015  are also affecting patient's functional outcome.   REHAB POTENTIAL: Good  CLINICAL DECISION MAKING: Evolving/moderate complexity  EVALUATION COMPLEXITY: Moderate   PLAN:  PT FREQUENCY: 1-2x/week  PT DURATION: 6 weeks  PLANNED INTERVENTIONS: Therapeutic exercises, Therapeutic activity, Neuromuscular re-education, Balance training, Gait training, Patient/Family education, Self Care, Joint mobilization, Stair training, Vestibular training, Canalith repositioning, Dry Needling, Electrical stimulation, Cryotherapy, Moist heat, Taping, Manual therapy, and Re-evaluation  PLAN FOR NEXT SESSION:review HEP, progress VOR and work on habituating turns, bending   Anette Guarneri, PT, DPT 01/27/23 7:49 AM  [facility] at [facility] [address] [place], [place] [address] Phone # [phone] Fax # [phone]

## 2023-01-26 ENCOUNTER — Encounter: Payer: Self-pay | Admitting: Physical Therapy

## 2023-01-26 ENCOUNTER — Ambulatory Visit: Payer: Medicare PPO | Admitting: Physical Therapy

## 2023-01-26 DIAGNOSIS — R2681 Unsteadiness on feet: Secondary | ICD-10-CM

## 2023-01-26 DIAGNOSIS — R42 Dizziness and giddiness: Secondary | ICD-10-CM | POA: Diagnosis not present

## 2023-01-26 DIAGNOSIS — N200 Calculus of kidney: Secondary | ICD-10-CM | POA: Diagnosis not present

## 2023-01-28 ENCOUNTER — Ambulatory Visit: Payer: Medicare PPO | Admitting: Neurology

## 2023-01-28 NOTE — Therapy (Signed)
OUTPATIENT PHYSICAL THERAPY VESTIBULAR TREATMENT     Patient Name: Clifford Garcia MRN: [id number] DOB:1945/10/18, 77 y.o., male Today's Date: 01/29/2023  END OF SESSION:  PT End of Session - 01/29/23 1616     Visit Number 3    Number of Visits 13    Date for PT Re-Evaluation 03/04/23    Authorization Type [employer]    Authorization Time Period approved 13 PT visits from 01/21/2023 - 03/04/2023    Authorization - Number of Visits 13    PT Start Time 1532    PT Stop Time 1614    PT Time Calculation (min) 42 min    Equipment Utilized During Treatment Gait belt    Activity Tolerance Patient tolerated treatment well    Behavior During Therapy WFL for tasks assessed/performed               Past Medical History:  Diagnosis Date   Arthritis    Eczema    Environmental allergies    History of gout    X 1 EPISODE AGE 22   History of kidney stones    Macular degeneration disease    Past Surgical History:  Procedure Laterality Date   CATARACTS REMOVED     TOTAL HIP ARTHROPLASTY Right 10/18/2013   Procedure: RIGHT TOTAL HIP ARTHROPLASTY ANTERIOR APPROACH;  Surgeon: Shelda Pal, MD;  Location: [facility];  Service: Orthopedics;  Laterality: Right;   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 Reports continues imbalance with narrow BOS. Reports "lightheaded"  currently.   Pt accompanied by: self  PERTINENT HISTORY: Macular degeneration, THA 2015  PAIN:  Are you having pain? No  PRECAUTIONS: None  RED FLAGS: None   WEIGHT BEARING RESTRICTIONS: No  FALLS: Has patient fallen in last 6 months? No  LIVING ENVIRONMENT: Lives with: lives with their spouse Lives in: House/apartment Stairs:  2 story house; 2 steps to enter   PLOF: Independent- semi-retired: works from [employer], sometimes travels. Likes yoga, tai-chi, taekwondo  PATIENT GOALS: improve dizziness   OBJECTIVE:    TODAY'S TREATMENT: 01/29/23 Activity Comments  sitting anterior canal habituation 2x each side EO, , 1x EC 0.5-2/10 dizziness  standing head turns/nods to targets 2x30" each Fast pace; 4-8/10 dizziness; 1/10 dizziness head nods  1/2 turns to targets Cues for quicker movement; slightly off balance and c/o "lightheadedness"  D2 flexion to cone on floor 2x5 each side C/o lightheaded/off balance on 1st rep; 2nd set at quicker pace; mild dizziness   walking EC, then along counter Pt somewhat fearful in open space, slightly better along counter       HOME EXERCISE PROGRAM Access Code: [email or web address] URL: [email or web address] Date: 01/29/2023 Prepared by: [facility] - Outpatient  Rehab - [facility]  Exercises - Seated Gaze Stabilization with Head Rotation  - 1 x daily - 5 x weekly - 2-3 sets - 30 sec hold - Seated  Gaze Stabilization with Head Nod  - 1 x daily - 5 x weekly - 2-3 sets - 30 sec hold - Standing with Head Rotation  - 1 x daily - 5 x weekly - 2-3 sets - 30 sec hold - Standing with Head Nod  - 1 x daily - 5 x weekly - 2-3 sets - 20 sec hold - Walking with Eyes Closed and Counter Support  - 1 x daily - 5 x weekly - 2 sets - 10 reps   PATIENT EDUCATION: Education details: HEP update, answered pt's questions Person educated: Patient Education method: Explanation, Demonstration, Tactile cues, Verbal cues, and Handouts Education  comprehension: verbalized understanding and returned demonstration   Below measures were taken at time of initial evaluation unless otherwise specified:   DIAGNOSTIC FINDINGS: 12/14/22 head CT:  No acute intracranial abnormality. No specific findings to explain headaches. 2. Moderate chronic small-vessel disease.  Orthostatics negative at neurology visit 01/13/23  COGNITION: Overall cognitive status: Within functional limits for tasks assessed   SENSATION: Pt reports intact sensation in B UEs/LEs   POSTURE:  rounded shoulders and forward head  Cervical ROM:    Active A/PROM (deg) eval  Flexion   Extension   Right lateral flexion   Left lateral flexion   Right rotation   Left rotation   (Blank rows = not tested)   GAIT: Gait pattern: WFL Assistive device utilized: None Level of assistance: Complete Independence   PATIENT SURVEYS:  FOTO 48, 58  VESTIBULAR ASSESSMENT:  GENERAL OBSERVATION: pt wears reading glasses and distance glasses for driving    OCULOMOTOR EXAM:  Ocular Alignment: normal  Ocular ROM:  slightly reduced R eye superior ROM  Spontaneous Nystagmus: absent  Gaze-Induced Nystagmus: absent  Smooth Pursuits: intact  Saccades: intact  Convergence/Divergence: 2 cm    VESTIBULAR - OCULAR REFLEX:   Slow VOR: Normal; c/o delayed slight dizziness   VOR Cancellation: Normal; c/o delayed slight dizziness   Head-Impulse Test: HIT Right: positive HIT Left: positive *c/o delayed dizziness       POSITIONAL TESTING:  Right Roll Test: negative Left Roll Test: negative  Right Sidelying: negative; c/o dizziness upon sitting up Left Sidelying: negative; c/o dizziness upon sitting up     M-CTSIB  Condition 1: Firm Surface, EO 30  Sec, Mild Sway  Condition 2: Firm Surface, EC 30 Sec, Moderate Sway  Condition 3: Foam Surface, EO 30 Sec, Mild Sway  Condition 4: Foam Surface, EC 5 Sec, Severe Sway     VESTIBULAR TREATMENT:                                                                                                    DATE: 01/21/23    PATIENT EDUCATION: Education details: prognosis, POC, HEP, edu and 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 Hypofunction, vestibular rehab and recovery time, function of VOR, intended level of sx with HEP Person educated: Patient Education method: Explanation, Demonstration, Tactile cues, Verbal cues, and Handouts Education comprehension: verbalized understanding and returned demonstration  HOME EXERCISE PROGRAM: Access Code: [email or web address] URL: [email or web address] Date:  01/21/2023 Prepared by: [facility] - Outpatient  Rehab - [facility]  Exercises - Seated Gaze Stabilization with Head Rotation  - 1 x daily - 5 x weekly - 2-3 sets - 30 sec hold - Seated Gaze Stabilization with Head Nod  - 1 x daily - 5 x weekly - 2-3 sets - 30 sec hold  GOALS: Goals reviewed with patient? Yes  SHORT TERM GOALS: Target date: 02/11/2023  Patient to be independent with initial HEP. Baseline: HEP initiated Goal status: IN PROGRESS    LONG TERM GOALS: Target date: 03/04/2023  Patient to be independent with advanced HEP. Baseline: Not yet initiated  Goal status: IN PROGRESS  Patient to report 0/10 dizziness with standing vertical and horizontal VOR for 30 seconds. Baseline: Unable Goal status: IN PROGRESS  Patient will report 0/10 dizziness with bed mobility.  Baseline: Symptomatic  Goal status: IN PROGRESS  Patient to maintain balance for M-CTSIB condition with eyes closed/foam surface for 20 sec in order to improve safety in environments with uneven surfaces and dim lighting. Baseline: 5 sec Goal status: IN PROGRESS  Patient to score at least 24/30 on FGA in order to decrease risk of falls. Baseline: 25/30 01/26/24 Goal status: MET  01/26/24  Patient to return to yoga and tai chi without dizziness limiting.  Baseline: reports dizziness Goal status: IN  PROGRESS  Patient to score at least 58 on FOTO in order to indicate improved functional outcomes.  Baseline: 48 Goal status: IN PROGRESS    ASSESSMENT:  CLINICAL IMPRESSION: Patient arrived to session with report of some remaining dizziness with tai chi. Initiated habituation with bending- this was tolerated with mild dizziness and imbalance. Encouraged faster pace with these movements to increase sx as this was well-tolerated. Reviewed standing gaze stabilization with cueing to slow down with these tasks d/t symptoms rising too high- improved after cueing. Patient is somewhat fearful with EC walking and required light counter support. Updated this into HEP for practice. Patient reported understanding and without complaints upon leaving.   OBJECTIVE IMPAIRMENTS: decreased activity tolerance, decreased balance, and dizziness.   ACTIVITY LIMITATIONS: lifting, bending, standing, squatting, stairs, transfers, bed mobility, bathing, toileting, dressing, reach over head, and hygiene/grooming  PARTICIPATION LIMITATIONS: meal prep, cleaning, laundry, driving, shopping, community activity, occupation, yard work, and church  PERSONAL FACTORS: Age, Fitness, Past/current experiences, Time since onset of injury/illness/exacerbation, and 3+ comorbidities: Macular degeneration, THA 2015  are also affecting patient's functional outcome.   REHAB POTENTIAL: Good  CLINICAL DECISION MAKING: Evolving/moderate complexity  EVALUATION COMPLEXITY: Moderate   PLAN:  PT FREQUENCY: 1-2x/week  PT DURATION: 6 weeks  PLANNED INTERVENTIONS: Therapeutic exercises, Therapeutic activity, Neuromuscular re-education, Balance training, Gait training, Patient/Family education, Self Care, Joint mobilization, Stair training, Vestibular training, Canalith repositioning, Dry Needling, Electrical stimulation, Cryotherapy, Moist heat, Taping, Manual therapy, and Re-evaluation  PLAN FOR NEXT SESSION:review HEP, progress VOR  and work on habituating turns, bending   Anette Guarneri, PT, DPT 01/29/23 4:18 PM  [facility] [facility] at [facility] [address] [place], [place] [address] Phone # [phone] Fax # [phone]

## 2023-01-29 ENCOUNTER — Encounter: Payer: Self-pay | Admitting: Physical Therapy

## 2023-01-29 ENCOUNTER — Ambulatory Visit: Payer: Medicare PPO | Attending: Neurology | Admitting: Physical Therapy

## 2023-01-29 DIAGNOSIS — R42 Dizziness and giddiness: Secondary | ICD-10-CM | POA: Insufficient documentation

## 2023-01-29 DIAGNOSIS — R2681 Unsteadiness on feet: Secondary | ICD-10-CM | POA: Diagnosis not present

## 2023-01-30 NOTE — Therapy (Signed)
OUTPATIENT PHYSICAL THERAPY VESTIBULAR TREATMENT     Patient Name: Clifford Garcia MRN: [id number] DOB:10/11/1945, 77 y.o., male Today's Date: 02/02/2023  END OF SESSION:  PT End of Session - 02/02/23 1613     Visit Number 4    Number of Visits 13    Date for PT Re-Evaluation 03/04/23    Authorization Type [employer]    Authorization Time Period approved 13 PT visits from 01/21/2023 - 03/04/2023    Authorization - Visit Number 4    Authorization - Number of Visits 13    PT Start Time 1531    PT Stop Time 1614    PT Time Calculation (min) 43 min    Equipment Utilized During Treatment --    Activity Tolerance Patient tolerated treatment well    Behavior During Therapy WFL for tasks assessed/performed                Past Medical History:  Diagnosis Date   Arthritis    Eczema    Environmental allergies    History of gout    X 1 EPISODE AGE 34   History of kidney stones    Macular degeneration disease    Past Surgical History:  Procedure Laterality Date   CATARACTS REMOVED     TOTAL HIP ARTHROPLASTY Right 10/18/2013   Procedure: RIGHT TOTAL HIP ARTHROPLASTY ANTERIOR APPROACH;  Surgeon: Shelda Pal, MD;  Location: [facility];  Service: Orthopedics;  Laterality: Right;   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 Reports that Thursday at tai chi he was  lightheaded   Pt accompanied by: self  PERTINENT HISTORY: Macular degeneration, THA 2015  PAIN:  Are you having pain? No  PRECAUTIONS: None  RED FLAGS: None   WEIGHT BEARING RESTRICTIONS: No  FALLS: Has patient fallen in last 6 months? No  LIVING ENVIRONMENT: Lives with: lives with their spouse Lives in: House/apartment Stairs:  2 story house; 2 steps to enter   PLOF: Independent- semi-retired: works from [employer], sometimes travels. Likes yoga, tai-chi, taekwondo  PATIENT GOALS: improve dizziness   OBJECTIVE:      TODAY'S TREATMENT: 02/02/23 Activity Comments  gait + horizontal and vertical VOR  gait + horizontal and vertical head turns  C/o 6-7/10 dizziness and mild-mod instability horizontal; 4-5/10 dizziness with vertical   standing diagonal head movements 2x30" Clarification on plane of motion; c/o mild dizziness upon stopping  wall bumps EO/EC foam shoulder strategy Pt initially fearful and required use of chair for support d/t instability   tennis ball bounce against wall + turn to pass to PT C/o trouble with coordinating movement; no dizziness              PATIENT EDUCATION: Education details: provided article on B vestibular dysfunction and how it affects spatial awareness  Person educated: Patient Education method: Explanation, Demonstration, Tactile cues, Verbal cues, and Handouts Education comprehension: verbalized understanding and returned demonstration  HOME EXERCISE PROGRAM Access Code: [email or web address] URL: [email or web address] Date: 01/29/2023 Prepared by: [facility] - Outpatient  Rehab - [facility]  Exercises - Standing with Head Rotation  - 1 x daily - 5 x weekly - 2-3 sets - 30 sec hold - Standing with Head Nod  - 1 x daily - 5 x weekly - 2-3 sets - 20 sec hold - Walking with Eyes Closed and Counter Support  - 1 x daily - 5 x weekly - 2 sets - 10 reps - Walking with Head Rotation  - 1 x daily - 5 x weekly - 2 sets - 5  reps - Walking with Head Nod  - 1 x daily - 5 x weekly - 2 sets - 5 reps    Below measures were taken at time of initial evaluation unless otherwise specified:   DIAGNOSTIC FINDINGS: 12/14/22 head CT:  No acute intracranial abnormality. No specific findings to explain headaches. 2. Moderate chronic small-vessel disease.  Orthostatics negative at neurology visit 01/13/23  COGNITION: Overall cognitive status: Within functional limits for tasks assessed   SENSATION: Pt reports intact sensation in B UEs/LEs   POSTURE:  rounded shoulders and forward head  Cervical ROM:    Active A/PROM (deg) eval  Flexion   Extension   Right lateral flexion   Left lateral flexion   Right rotation   Left rotation   (Blank rows = not tested)   GAIT: Gait pattern: WFL Assistive device utilized: None Level of assistance: Complete Independence   PATIENT SURVEYS:  FOTO 48, 58  VESTIBULAR ASSESSMENT:  GENERAL OBSERVATION: pt wears reading glasses and distance glasses for driving    OCULOMOTOR EXAM:  Ocular Alignment: normal  Ocular ROM:  slightly reduced R eye superior ROM  Spontaneous Nystagmus: absent  Gaze-Induced Nystagmus: absent  Smooth Pursuits: intact  Saccades: intact  Convergence/Divergence: 2 cm    VESTIBULAR - OCULAR REFLEX:   Slow VOR: Normal; c/o delayed slight dizziness   VOR Cancellation: Normal; c/o delayed slight dizziness   Head-Impulse Test: HIT Right: positive HIT Left: positive *c/o delayed dizziness       POSITIONAL TESTING:  Right Roll Test: negative Left Roll Test: negative  Right Sidelying: negative; c/o dizziness upon sitting up Left Sidelying: negative; c/o dizziness upon sitting up     M-CTSIB  Condition 1: Firm Surface, EO 30  Sec, Mild Sway  Condition 2: Firm Surface, EC 30 Sec, Moderate Sway  Condition 3: Foam Surface, EO 30 Sec, Mild Sway  Condition 4: Foam Surface, EC 5 Sec, Severe Sway     VESTIBULAR TREATMENT:                                                                                                    DATE: 01/21/23    PATIENT EDUCATION: Education details: prognosis, POC, HEP, edu and 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 Hypofunction, vestibular rehab and recovery time, function of VOR, intended level of sx with HEP Person educated: Patient Education method: Explanation, Demonstration, Tactile cues, Verbal cues, and Handouts Education comprehension: verbalized understanding and  returned demonstration  HOME EXERCISE PROGRAM: Access Code: [email or web address] URL: [email or web address] Date: 01/21/2023 Prepared by: [facility] - Outpatient  Rehab - [facility]  Exercises - Seated Gaze Stabilization with Head Rotation  - 1 x daily - 5 x weekly - 2-3 sets - 30 sec hold - Seated Gaze Stabilization with Head Nod  - 1 x daily - 5 x weekly - 2-3 sets - 30 sec hold  GOALS: Goals reviewed with patient? Yes  SHORT TERM GOALS: Target date: 02/11/2023  Patient to be independent with initial HEP. Baseline: HEP initiated Goal status: IN PROGRESS    LONG TERM GOALS: Target date: 03/04/2023  Patient to be independent with advanced HEP. Baseline: Not yet initiated  Goal status: IN PROGRESS  Patient to report 0/10 dizziness with standing vertical and horizontal VOR for 30 seconds. Baseline: Unable Goal status: IN PROGRESS  Patient will report 0/10 dizziness with bed mobility.  Baseline: Symptomatic  Goal status: IN PROGRESS  Patient to maintain balance for M-CTSIB condition with eyes closed/foam surface for 20 sec in order to improve safety in environments with uneven surfaces and dim lighting. Baseline: 5 sec Goal status: IN PROGRESS  Patient to score at least 24/30 on FGA in order to decrease risk of falls. Baseline: 25/30 01/26/24 Goal status: MET  01/26/24  Patient to return to yoga and tai chi without dizziness limiting.  Baseline: reports dizziness Goal  status: IN PROGRESS  Patient to score at least 58 on FOTO in order to indicate improved functional outcomes.  Baseline: 48 Goal status: IN PROGRESS    ASSESSMENT:  CLINICAL IMPRESSION: Patient arrived to session with report of improvement in tolerance for VOR at home. Worked on walking VOR/gaze stability exercises which revealed dizziness and mild-mod imbalance as this was a progression from HEP. Trialed multisensory balance training which patient was apprehensive with; required increased cues for proper sequencing. Quick turns today were tolerated without dizziness. Patient reported understanding of HEP updates and without complaints upon leaving.   OBJECTIVE IMPAIRMENTS: decreased activity tolerance, decreased balance, and dizziness.   ACTIVITY LIMITATIONS: lifting, bending, standing, squatting, stairs, transfers, bed mobility, bathing, toileting, dressing, reach over head, and hygiene/grooming  PARTICIPATION LIMITATIONS: meal prep, cleaning, laundry, driving, shopping, community activity, occupation, yard work, and church  PERSONAL FACTORS: Age, Fitness, Past/current experiences, Time since onset of injury/illness/exacerbation, and 3+ comorbidities: Macular degeneration, THA 2015  are also affecting patient's functional outcome.   REHAB POTENTIAL: Good  CLINICAL DECISION MAKING: Evolving/moderate complexity  EVALUATION COMPLEXITY: Moderate   PLAN:  PT FREQUENCY: 1-2x/week  PT DURATION: 6 weeks  PLANNED INTERVENTIONS: Therapeutic exercises, Therapeutic activity, Neuromuscular re-education, Balance training, Gait training, Patient/Family education, Self Care, Joint mobilization, Stair training, Vestibular training, Canalith repositioning, Dry Needling, Electrical stimulation, Cryotherapy, Moist heat, Taping, Manual therapy, and Re-evaluation  PLAN FOR NEXT SESSION:review HEP, progress VOR and work on habituating turns, bending   Anette Guarneri, PT, DPT 02/02/23 4:15  PM  [facility] at [facility] [address] [place], [place] [address] Phone # [phone] Fax # [phone]

## 2023-02-02 ENCOUNTER — Ambulatory Visit: Payer: Medicare PPO | Admitting: Physical Therapy

## 2023-02-02 ENCOUNTER — Encounter: Payer: Self-pay | Admitting: Physical Therapy

## 2023-02-02 DIAGNOSIS — R2681 Unsteadiness on feet: Secondary | ICD-10-CM

## 2023-02-02 DIAGNOSIS — R42 Dizziness and giddiness: Secondary | ICD-10-CM | POA: Diagnosis not present

## 2023-02-04 ENCOUNTER — Ambulatory Visit: Payer: Medicare PPO | Admitting: *Deleted

## 2023-02-04 DIAGNOSIS — Z0000 Encounter for general adult medical examination without abnormal findings: Secondary | ICD-10-CM | POA: Diagnosis not present

## 2023-02-04 NOTE — Progress Notes (Signed)
Subjective:   Clifford Garcia is a 77 y.o. male who presents for Medicare Annual/Subsequent preventive examination.  Visit Complete: Virtual I connected with  Norval Morton on 02/04/23 by a audio enabled telemedicine application and verified that I am speaking with the correct person using two identifiers.  Patient Location: Home  Provider Location: Home Office  I discussed the limitations of evaluation and management by telemedicine. The patient expressed understanding and agreed to proceed.  Vital Signs: Because this visit was a virtual/telehealth visit, some criteria may be missing or patient reported. Any vitals not documented were not able to be obtained and vitals that have been documented are patient reported.  Patient Medicare AWV questionnaire was completed by the patient on 01-31-2023; I have confirmed that all information answered by patient is correct and no changes since this date.  Cardiac Risk Factors include: advanced age (>55men, >59 women);male gender     Objective:    There were no vitals filed for this visit. There is no height or weight on file to calculate BMI.     02/04/2023   11:07 AM 01/21/2023   12:43 PM 01/13/2023    8:52 AM 05/25/2018   11:31 AM 10/18/2013   12:10 PM 10/13/2013    9:13 AM  Advanced Directives  Does Patient Have a Medical Advance Directive? Yes Yes Yes No Patient has advance directive, copy not in chart;Patient would not like information Patient has advance directive, copy not in chart;Patient would not like information  Type of Advance Directive Living will Healthcare Power of [place];Out of facility DNR (pink MOST or yellow form) Healthcare Power of [place]card;Out of facility DNR (pink MOST or yellow form)     Copy of Healthcare Power of Attorney in Chart? No - copy requested    Copy requested from family Copy requested from family  Would patient like information on creating a medical advance directive?    No - Patient declined     Pre-existing out of facility DNR order (yellow form or pink MOST form)     No     Current Medications (verified) Outpatient Encounter Medications as of 02/04/2023  Medication Sig   desonide (DESOWEN) 0.05 % cream  (Patient not taking: Reported on 01/21/2023)   meclizine (ANTIVERT) 12.5 MG tablet Take 1 tablet (12.5 mg total) by mouth 3 (three) times daily as needed for dizziness. (Patient not taking: Reported on 01/21/2023)   No facility-administered encounter medications on file as of 02/04/2023.    Allergies (verified) Tetracycline hcl and Tetracyclines & related   History: Past Medical History:  Diagnosis Date   Arthritis    Eczema    Environmental allergies    History of gout    X 1 EPISODE AGE 2   History of kidney stones    Macular degeneration disease    Past Surgical History:  Procedure Laterality Date   CATARACTS REMOVED     TOTAL HIP ARTHROPLASTY Right 10/18/2013   Procedure: RIGHT TOTAL HIP ARTHROPLASTY ANTERIOR APPROACH;  Surgeon: Shelda Pal, MD;  Location: [facility];  Service: Orthopedics;  Laterality: Right;   Family History  Problem Relation Age of Onset   CAD Father    Spina bifida Sister    Leukemia Brother    Dementia Brother    Healthy Daughter    Social History   Socioeconomic History   Marital status: Significant Other    Spouse name: Not on file   Number of children: 2   Years of education:  Not on file   Highest education level: Doctorate  Occupational History   Not on file  Tobacco Use   Smoking status: Former    Current packs/day: 0.00    Types: Cigarettes    Quit date: 10/13/1984    Years since quitting: 38.3   Smokeless tobacco: Never  Vaping Use   Vaping status: Never Used  Substance and Sexual Activity   Alcohol use: Yes    Comment: White wine 1x week   Drug use: Never   Sexual activity: Yes  Other Topics Concern   Not on file  Social History Narrative   Are you right handed or left handed? right   Are you currently  employed ? 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 Partner    What type of home do you live in: 1 story or 2 story? 2 story 15 steps       Social Determinants of Health   Financial Resource Strain: Low Risk  (02/04/2023)   Overall Financial Resource Strain (CARDIA)    Difficulty of Paying Living Expenses: Not hard at all  Food Insecurity: No Food Insecurity (02/04/2023)   Hunger Vital Sign    Worried About Running Out of Food in the Last Year: Never true    Ran Out of Food in the Last Year: Never true  Transportation Needs: No Transportation Needs (02/04/2023)   PRAPARE - Administrator, Civil Service (Medical): No    Lack of Transportation (Non-Medical): No  Physical Activity: Sufficiently Active (02/04/2023)   Exercise Vital Sign    Days of Exercise per Week: 4 days    Minutes of Exercise per Session: 50 min  Stress: No Stress Concern Present (02/04/2023)   [employer] of Occupational Health - Occupational Stress Questionnaire    Feeling of Stress : Not at all  Social Connections: Socially Integrated (02/04/2023)   Social Connection and Isolation Panel [NHANES]    Frequency of Communication with Friends and Family: More than three times a week    Frequency of Social Gatherings with Friends and Family: Once a week    Attends Religious Services: 1 to 4 times per year    Active Member of [employer] or Organizations: Yes    Attends Banker Meetings: 1 to 4 times per year    Marital Status: Living with partner    Tobacco Counseling Counseling given: Not Answered   Clinical Intake:  Pre-visit preparation completed: Yes  Pain : No/denies pain     Diabetes: No  How often do you need to have someone help you when you read instructions, pamphlets, or other written materials from your doctor or pharmacy?: (P) 1 - Never  Interpreter Needed?: No  Information  entered by :: Remi Haggard LPN   Activities of Daily Living    02/04/2023   11:08 AM 01/31/2023   10:57 AM  In your present state of health, do you have any difficulty performing the following activities:  Hearing? 1 1  Vision? 0 0  Difficulty concentrating or making decisions? 0 0  Walking or climbing stairs? 0 0  Dressing or bathing? 0 0  Doing errands, shopping? 0 0  Preparing Food and eating ? N N  Using the Toilet? N N  In the past six months, have you accidently leaked urine? N N  Do you have problems  with loss of bowel control? N N  Managing your Medications? N N  Managing your Finances? N N  Housekeeping or managing your Housekeeping? N N    Patient Care Team: Alveria Apley, NP as PCP - General (Family Medicine) [place], [facility] Dr.Wrenn (Audiology) Dr.Kathleen Gertie Exon (Chiropractic Medicine) Ortho, [facility] (Specialist) [facility], [place], [place] Donzetta Starch, MD as Consulting Physician (Dermatology) Daisy Lazar, DMD (Dentistry) Van Clines, MD as Consulting Physician (Neurology)  Indicate any recent Medical Services you may have received from other than [facility] providers in the past year (date may be approximate).     Assessment:   This is a routine wellness examination for Gabe.  Hearing/Vision screen Hearing Screening - Comments:: Hearing aid R ear  Vision Screening - Comments:: Up to date [facility]   Goals Addressed             This Visit's Progress    Patient Stated       Would like to get certification for yoga        Depression Screen    02/04/2023   11:11 AM 01/19/2023   11:12 AM 12/09/2022   10:53 AM 11/19/2022    2:30 PM 06/24/2022   11:09 AM 12/23/2021   10:57 AM 12/23/2021   10:12 AM  PHQ 2/9 Scores  PHQ - 2 Score 0 0 0 0 0 0 0  PHQ- 9 Score 0 0 2 2 1  0 2    Fall Risk    02/04/2023   11:06 AM 01/31/2023   10:57 AM 01/19/2023   11:12 AM 01/13/2023    8:52 AM 12/09/2022   10:52 AM  Fall Risk   Falls in  the past year? 0 0 0 0 0  Number falls in past yr: 0 0 0 0 0  Injury with Fall? 0 0 0 0 0  Risk for fall due to :   No Fall Risks  No Fall Risks  Follow up Falls evaluation completed;Education provided;Falls prevention discussed  Falls evaluation completed Falls evaluation completed Falls evaluation completed    MEDICARE RISK AT HOME: Medicare Risk at Home Any stairs in or around the home?: Yes If so, are there any without handrails?: No Home free of loose throw rugs in walkways, pet beds, electrical cords, etc?: Yes Adequate lighting in your home to reduce risk of falls?: Yes Life alert?: No Use of a cane, walker or w/c?: No Grab bars in the bathroom?: No Shower chair or bench in shower?: No Elevated toilet seat or a handicapped toilet?: No  TIMED UP AND GO:  Was the test performed?  No    Cognitive Function:    12/18/2020   10:36 AM  MMSE - Mini Mental State Exam  Orientation to time 5  Orientation to Place 5  Registration 3  Attention/ Calculation 5  Recall 3  Language- name 2 objects 2  Language- repeat 1  Language- follow 3 step command 3  Language- read & follow direction 1  Write a sentence 1  Copy design 1  Total score 30        02/04/2023   11:12 AM  6CIT Screen  What Year? 0 points  What month? 0 points  What time? 0 points  Count back from 20 0 points  Months in reverse 0 points  Repeat phrase 0 points  Total Score 0 points    Immunizations Immunization History  Administered Date(s) Administered   Influenza, High Dose Seasonal PF 03/25/2019  Influenza-Unspecified 01/08/2022   PFIZER Comirnaty(Gray Top)Covid-19 Tri-Sucrose Vaccine 07/31/2020   PFIZER(Purple Top)SARS-COV-2 Vaccination 06/24/2019, 07/20/2019, 06/26/2020   PNEUMOCOCCAL CONJUGATE-20 09/14/2020   Td 10/17/2020    TDAP status: Up to date  Flu Vaccine status: Up to date  Pneumococcal vaccine status: Up to date  Covid-19 vaccine status: Information provided on how to obtain  vaccines.   Qualifies for Shingles Vaccine? Yes   Zostavax completed No   Shingrix Completed?: No.    Education has been provided regarding the importance of this vaccine. Patient has been advised to call insurance company to determine out of pocket expense if they have not yet received this vaccine. Advised may also receive vaccine at local pharmacy or Health Dept. Verbalized acceptance and understanding.  Screening Tests Health Maintenance  Topic Date Due   Hepatitis C Screening  Never done   Zoster Vaccines- Shingrix (1 of 2) 05/07/2023 (Originally 05/14/1993)   INFLUENZA VACCINE  07/27/2023 (Originally 11/27/2022)   COVID-19 Vaccine (5 - 2023-24 season) 02/03/2024 (Originally 12/28/2022)   Medicare Annual Wellness (AWV)  02/04/2024   DTaP/Tdap/Td (2 - Tdap) 10/18/2030   Pneumonia Vaccine 54+ Years old  Completed   HPV VACCINES  Aged Out    Health Maintenance  Health Maintenance Due  Topic Date Due   Hepatitis C Screening  Never done    Colorectal cancer screening: No longer required.   Lung Cancer Screening: (Low Dose CT Chest recommended if Age 94-80 years, 20 pack-year currently smoking OR have quit w/in 15years.) does not qualify.   Lung Cancer Screening Referral:   Additional Screening:  Hepatitis C Screening  never done  Vision Screening: Recommended annual ophthalmology exams for early detection of glaucoma and other disorders of the eye. Is the patient up to date with their annual eye exam?  Yes  Who is the provider or what is the name of the office in which the patient attends annual eye exams? [facility] If pt is not established with a provider, would they like to be referred to a provider to establish care? No .   Dental Screening: Recommended annual dental exams for proper oral hygiene    Community Resource Referral / Chronic Care Management: CRR required this visit?  No   CCM required this visit?  No     Plan:     I have personally reviewed and noted the  following in the patient's chart:   Medical and social history Use of alcohol, tobacco or illicit drugs  Current medications and supplements including opioid prescriptions. Patient is not currently taking opioid prescriptions. Functional ability and status Nutritional status Physical activity Advanced directives List of other physicians Hospitalizations, surgeries, and ER visits in previous 12 months Vitals Screenings to include cognitive, depression, and falls Referrals and appointments  In addition, I have reviewed and discussed with patient certain preventive protocols, quality metrics, and best practice recommendations. A written personalized care plan for preventive services as well as general preventive health recommendations were provided to patient.     Remi Haggard, LPN   [id number]   After Visit Summary: (MyChart) Due to this being a telephonic visit, the after visit summary with patients personalized plan was offered to patient via MyChart   Nurse Notes:

## 2023-02-04 NOTE — Therapy (Signed)
OUTPATIENT PHYSICAL THERAPY VESTIBULAR TREATMENT     Patient Name: Clifford Garcia MRN: [id number] DOB:Jan 18, 1946, 77 y.o., male Today's Date: 02/04/2023  END OF SESSION:       Past Medical History:  Diagnosis Date   Arthritis    Eczema    Environmental allergies    History of gout    X 1 EPISODE AGE 15   History of kidney stones    Macular degeneration disease    Past Surgical History:  Procedure Laterality Date   CATARACTS REMOVED     TOTAL HIP ARTHROPLASTY Right 10/18/2013   Procedure: RIGHT TOTAL HIP ARTHROPLASTY ANTERIOR APPROACH;  Surgeon: Shelda Pal, MD;  Location: [facility];  Service: Orthopedics;  Laterality: Right;   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  ONSET DATE: 11/11/22  Rationale for Evaluation and Treatment: Rehabilitation  SUBJECTIVE:   SUBJECTIVE STATEMENT: Reports that VOR is getting easer. Reports that Thursday at tai chi he was lightheaded   Pt accompanied by: self  PERTINENT HISTORY: Macular degeneration, THA 2015  PAIN:  Are you having pain? No  PRECAUTIONS: None  RED FLAGS: None   WEIGHT BEARING RESTRICTIONS: No  FALLS: Has patient fallen in last 6 months? No  LIVING ENVIRONMENT: Lives with: lives with their spouse Lives in: House/apartment Stairs:  2 story house; 2 steps to enter   PLOF: Independent- semi-retired: works from [employer], sometimes travels. Likes yoga, tai-chi, taekwondo  PATIENT GOALS: improve dizziness   OBJECTIVE:     TODAY'S TREATMENT: 02/05/23 Activity Comments                          HOME EXERCISE PROGRAM Access Code: [email or web address] URL: [email or web address] Date: 01/29/2023 Prepared by: [facility] - Outpatient  Rehab - [facility]  Exercises - Standing with Head Rotation  - 1 x daily - 5 x weekly - 2-3 sets - 30 sec hold - Standing with Head Nod  - 1 x daily - 5 x weekly - 2-3 sets - 20 sec hold - Walking with Eyes Closed and Counter Support  - 1 x daily - 5 x weekly - 2 sets - 10 reps - Walking with Head Rotation  - 1 x daily - 5 x weekly - 2 sets - 5 reps - Walking with Head Nod  - 1 x daily - 5 x weekly - 2 sets - 5 reps    Below measures were taken at time of initial evaluation unless otherwise specified:   DIAGNOSTIC FINDINGS: 12/14/22 head CT:  No acute intracranial abnormality. No specific findings to explain headaches. 2. Moderate chronic small-vessel disease.  Orthostatics negative at neurology visit 01/13/23  COGNITION: Overall cognitive status: Within functional limits for tasks assessed   SENSATION: Pt reports intact sensation in B UEs/LEs   POSTURE:  rounded shoulders and forward head  Cervical ROM:    Active A/PROM (deg) eval  Flexion   Extension   Right lateral flexion   Left lateral flexion   Right rotation   Left rotation   (Blank rows =  not tested)   GAIT: Gait pattern: WFL Assistive device utilized: None Level of assistance: Complete Independence   PATIENT SURVEYS:  FOTO 48, 58  VESTIBULAR ASSESSMENT:  GENERAL OBSERVATION: pt wears reading glasses and distance glasses for driving    OCULOMOTOR EXAM:  Ocular Alignment: normal  Ocular ROM:  slightly reduced R eye superior ROM  Spontaneous Nystagmus: absent  Gaze-Induced Nystagmus: absent  Smooth Pursuits: intact  Saccades: intact  Convergence/Divergence: 2 cm    VESTIBULAR - OCULAR REFLEX:   Slow VOR: Normal; c/o delayed slight dizziness   VOR Cancellation: Normal; c/o delayed slight dizziness   Head-Impulse Test:  HIT Right: positive HIT Left: positive *c/o delayed dizziness       POSITIONAL TESTING:  Right Roll Test: negative Left Roll Test: negative  Right Sidelying: negative; c/o dizziness upon sitting up Left Sidelying: negative; c/o dizziness upon sitting up     M-CTSIB  Condition 1: Firm Surface, EO 30  Sec, Mild Sway  Condition 2: Firm Surface, EC 30 Sec, Moderate Sway  Condition 3: Foam Surface, EO 30 Sec, Mild Sway  Condition 4: Foam Surface, EC 5 Sec, Severe Sway     VESTIBULAR TREATMENT:                                                                                                   DATE: 01/21/23    PATIENT EDUCATION: Education details: prognosis, POC, HEP, edu and 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 Hypofunction, vestibular rehab and recovery time, function of VOR, intended level of sx with HEP Person educated: Patient Education method: Explanation, Demonstration, Tactile cues, Verbal cues, and Handouts Education comprehension: verbalized understanding and returned demonstration  HOME EXERCISE PROGRAM: Access Code: [email or web address] URL: [email or web address] Date: 01/21/2023 Prepared by: [facility] - Outpatient  Rehab - [facility]  Exercises - Seated Gaze Stabilization with Head Rotation  - 1 x daily - 5 x weekly - 2-3 sets - 30 sec hold - Seated Gaze Stabilization with Head Nod  - 1 x daily - 5 x weekly - 2-3 sets - 30 sec hold  GOALS: Goals reviewed with patient? Yes  SHORT TERM GOALS: Target date: 02/11/2023  Patient to be independent with initial HEP. Baseline: HEP initiated Goal status: IN PROGRESS    LONG TERM GOALS: Target date: 03/04/2023  Patient to be independent with advanced HEP. Baseline: Not yet initiated  Goal status: IN PROGRESS  Patient to report 0/10 dizziness with standing vertical and horizontal VOR for 30 seconds. Baseline: Unable Goal status: IN PROGRESS  Patient will report 0/10  dizziness with bed mobility.  Baseline: Symptomatic  Goal status: IN PROGRESS  Patient to maintain balance for M-CTSIB condition with eyes closed/foam surface for 20 sec in order to improve safety in environments with uneven surfaces and dim lighting. Baseline: 5 sec Goal status: IN PROGRESS  Patient to score at least 24/30 on FGA in order to decrease risk of falls. Baseline: 25/30 01/26/24 Goal status: MET  01/26/24  Patient to return to yoga and tai chi without dizziness limiting.  Baseline: reports dizziness Goal  status: IN PROGRESS  Patient to score at least 58 on FOTO in order to indicate improved functional outcomes.  Baseline: 48 Goal status: IN PROGRESS    ASSESSMENT:  CLINICAL IMPRESSION: Patient arrived to session with report of improvement in tolerance for VOR at home. Worked on walking VOR/gaze stability exercises which revealed dizziness and mild-mod imbalance as this was a progression from HEP. Trialed multisensory balance training which patient was apprehensive with; required increased cues for proper sequencing. Quick turns today were tolerated without dizziness. Patient reported understanding of HEP updates and without complaints upon leaving.   OBJECTIVE IMPAIRMENTS: decreased activity tolerance, decreased balance, and dizziness.   ACTIVITY LIMITATIONS: lifting, bending, standing, squatting, stairs, transfers, bed mobility, bathing, toileting, dressing, reach over head, and hygiene/grooming  PARTICIPATION LIMITATIONS: meal prep, cleaning, laundry, driving, shopping, community activity, occupation, yard work, and church  PERSONAL FACTORS: Age, Fitness, Past/current experiences, Time since onset of injury/illness/exacerbation, and 3+ comorbidities: Macular degeneration, THA 2015  are also affecting patient's functional outcome.   REHAB POTENTIAL: Good  CLINICAL DECISION MAKING: Evolving/moderate complexity  EVALUATION COMPLEXITY: Moderate   PLAN:  PT  FREQUENCY: 1-2x/week  PT DURATION: 6 weeks  PLANNED INTERVENTIONS: Therapeutic exercises, Therapeutic activity, Neuromuscular re-education, Balance training, Gait training, Patient/Family education, Self Care, Joint mobilization, Stair training, Vestibular training, Canalith repositioning, Dry Needling, Electrical stimulation, Cryotherapy, Moist heat, Taping, Manual therapy, and Re-evaluation  PLAN FOR NEXT SESSION:review HEP, progress VOR and work on habituating turns, bending   Anette Guarneri, PT, DPT 02/04/23 12:20 PM  [facility] at [facility] [address] [place], [place] [address] Phone # [phone] Fax # [phone]

## 2023-02-04 NOTE — Patient Instructions (Signed)
Clifford Garcia , Thank you for taking time to come for your Medicare Wellness Visit. I appreciate your ongoing commitment to your health goals. Please review the following plan we discussed and let me know if I can assist you in the future.   Screening recommendations/referrals:  Recommended yearly ophthalmology/optometry visit for glaucoma screening and checkup Recommended yearly dental visit for hygiene and checkup  Vaccinations: Influenza vaccine:  Pneumococcal vaccine:  Tdap vaccine:  Shingles vaccine:     Advanced directives:     Preventive Care 79 Years and Older, Male Preventive care refers to lifestyle choices and visits with your health care provider that can promote health and wellness. What does preventive care include? A yearly physical exam. This is also called an annual well check. Dental exams once or twice a year. Routine eye exams. Ask your health care provider how often you should have your eyes checked. Personal lifestyle choices, including: Daily care of your teeth and gums. Regular physical activity. Eating a healthy diet. Avoiding tobacco and drug use. Limiting alcohol use. Practicing safe sex. Taking low doses of aspirin every day. Taking vitamin and mineral supplements as recommended by your health care provider. What happens during an annual well check? The services and screenings done by your health care provider during your annual well check will depend on your age, overall health, lifestyle risk factors, and family history of disease. Counseling  Your health care provider may ask you questions about your: Alcohol use. Tobacco use. Drug use. Emotional well-being. Home and relationship well-being. Sexual activity. Eating habits. History of falls. Memory and ability to understand (cognition). Work and work Astronomer. Screening  You may have the following tests or measurements: Height, weight, and BMI. Blood pressure. Lipid and cholesterol  levels. These may be checked every 5 years, or more frequently if you are over 79 years old. Skin check. Lung cancer screening. You may have this screening every year starting at age 79 if you have a 30-pack-year history of smoking and currently smoke or have quit within the past 15 years. Fecal occult blood test (FOBT) of the stool. You may have this test every year starting at age 79. Flexible sigmoidoscopy or colonoscopy. You may have a sigmoidoscopy every 5 years or a colonoscopy every 10 years starting at age 79. Prostate cancer screening. Recommendations will vary depending on your family history and other risks. Hepatitis C blood test. Hepatitis B blood test. Sexually transmitted disease (STD) testing. Diabetes screening. This is done by checking your blood sugar (glucose) after you have not eaten for a while (fasting). You may have this done every 1-3 years. Abdominal aortic aneurysm (AAA) screening. You may need this if you are a current or former smoker. Osteoporosis. You may be screened starting at age 79 if you are at high risk. Talk with your health care provider about your test results, treatment options, and if necessary, the need for more tests. Vaccines  Your health care provider may recommend certain vaccines, such as: Influenza vaccine. This is recommended every year. Tetanus, diphtheria, and acellular pertussis (Tdap, Td) vaccine. You may need a Td booster every 10 years. Zoster vaccine. You may need this after age 79. Pneumococcal 13-valent conjugate (PCV13) vaccine. One dose is recommended after age 79. Pneumococcal polysaccharide (PPSV23) vaccine. One dose is recommended after age 79. Talk to your health care provider about which screenings and vaccines you need and how often you need them. This information is not intended to replace advice given to you by  your health care provider. Make sure you discuss any questions you have with your health care provider. Document  Released: 05/11/2015 Document Revised: 01/02/2016 Document Reviewed: 02/13/2015 Elsevier Interactive Patient Education  2017 [employer].  Fall Prevention in the Home Falls can cause injuries. They can happen to people of all ages. There are many things you can do to make your home safe and to help prevent falls. What can I do on the outside of my home? Regularly fix the edges of walkways and driveways and fix any cracks. Remove anything that might make you trip as you walk through a door, such as a raised step or threshold. Trim any bushes or trees on the path to your home. Use bright outdoor lighting. Clear any walking paths of anything that might make someone trip, such as rocks or tools. Regularly check to see if handrails are loose or broken. Make sure that both sides of any steps have handrails. Any raised decks and porches should have guardrails on the edges. Have any leaves, snow, or ice cleared regularly. Use sand or salt on walking paths during winter. Clean up any spills in your garage right away. This includes oil or grease spills. What can I do in the bathroom? Use night lights. Install grab bars by the toilet and in the tub and shower. Do not use towel bars as grab bars. Use non-skid mats or decals in the tub or shower. If you need to sit down in the shower, use a plastic, non-slip stool. Keep the floor dry. Clean up any water that spills on the floor as soon as it happens. Remove soap buildup in the tub or shower regularly. Attach bath mats securely with double-sided non-slip rug tape. Do not have throw rugs and other things on the floor that can make you trip. What can I do in the bedroom? Use night lights. Make sure that you have a light by your bed that is easy to reach. Do not use any sheets or blankets that are too big for your bed. They should not hang down onto the floor. Have a firm chair that has side arms. You can use this for support while you get dressed. Do  not have throw rugs and other things on the floor that can make you trip. What can I do in the kitchen? Clean up any spills right away. Avoid walking on wet floors. Keep items that you use a lot in easy-to-reach places. If you need to reach something above you, use a strong step stool that has a grab bar. Keep electrical cords out of the way. Do not use floor polish or wax that makes floors slippery. If you must use wax, use non-skid floor wax. Do not have throw rugs and other things on the floor that can make you trip. What can I do with my stairs? Do not leave any items on the stairs. Make sure that there are handrails on both sides of the stairs and use them. Fix handrails that are broken or loose. Make sure that handrails are as long as the stairways. Check any carpeting to make sure that it is firmly attached to the stairs. Fix any carpet that is loose or worn. Avoid having throw rugs at the top or bottom of the stairs. If you do have throw rugs, attach them to the floor with carpet tape. Make sure that you have a light switch at the top of the stairs and the bottom of the stairs. If you  do not have them, ask someone to add them for you. What else can I do to help prevent falls? Wear shoes that: Do not have high heels. Have rubber bottoms. Are comfortable and fit you well. Are closed at the toe. Do not wear sandals. If you use a stepladder: Make sure that it is fully opened. Do not climb a closed stepladder. Make sure that both sides of the stepladder are locked into place. Ask someone to hold it for you, if possible. Clearly mark and make sure that you can see: Any grab bars or handrails. First and last steps. Where the edge of each step is. Use tools that help you move around (mobility aids) if they are needed. These include: Canes. Walkers. Scooters. Crutches. Turn on the lights when you go into a dark area. Replace any light bulbs as soon as they burn out. Set up your  furniture so you have a clear path. Avoid moving your furniture around. If any of your floors are uneven, fix them. If there are any pets around you, be aware of where they are. Review your medicines with your doctor. Some medicines can make you feel dizzy. This can increase your chance of falling. Ask your doctor what other things that you can do to help prevent falls. This information is not intended to replace advice given to you by your health care provider. Make sure you discuss any questions you have with your health care provider. Document Released: 02/08/2009 Document Revised: 09/20/2015 Document Reviewed: 05/19/2014 Elsevier Interactive Patient Education  2017 [employer].

## 2023-02-05 ENCOUNTER — Encounter: Payer: Self-pay | Admitting: Physical Therapy

## 2023-02-05 ENCOUNTER — Ambulatory Visit: Payer: Medicare PPO | Admitting: Physical Therapy

## 2023-02-05 DIAGNOSIS — R2681 Unsteadiness on feet: Secondary | ICD-10-CM

## 2023-02-05 DIAGNOSIS — R42 Dizziness and giddiness: Secondary | ICD-10-CM

## 2023-02-06 DIAGNOSIS — M9902 Segmental and somatic dysfunction of thoracic region: Secondary | ICD-10-CM | POA: Diagnosis not present

## 2023-02-06 DIAGNOSIS — M9901 Segmental and somatic dysfunction of cervical region: Secondary | ICD-10-CM | POA: Diagnosis not present

## 2023-02-06 DIAGNOSIS — M9903 Segmental and somatic dysfunction of lumbar region: Secondary | ICD-10-CM | POA: Diagnosis not present

## 2023-02-06 NOTE — Therapy (Signed)
OUTPATIENT PHYSICAL THERAPY VESTIBULAR TREATMENT     Patient Name: Clifford Garcia MRN: [id number] DOB:1945/06/21, 77 y.o., male Today's Date: 02/09/2023  END OF SESSION:  PT End of Session - 02/09/23 1442     Visit Number 6    Number of Visits 13    Date for PT Re-Evaluation 03/04/23    Authorization Type [employer]    Authorization Time Period approved 13 PT visits from 01/21/2023 - 03/04/2023    Authorization - Visit Number 6    Authorization - Number of Visits 13    PT Start Time 1359    PT Stop Time 1443    PT Time Calculation (min) 44 min    Equipment Utilized During Treatment Gait belt    Activity Tolerance Patient tolerated treatment well    Behavior During Therapy WFL for tasks assessed/performed                  Past Medical History:  Diagnosis Date   Arthritis    Eczema    Environmental allergies    History of gout    X 1 EPISODE AGE 47   History of kidney stones    Macular degeneration disease    Past Surgical History:  Procedure Laterality Date   CATARACTS REMOVED     TOTAL HIP ARTHROPLASTY Right 10/18/2013   Procedure: RIGHT TOTAL HIP ARTHROPLASTY ANTERIOR APPROACH;  Surgeon: Shelda Pal, MD;  Location: [facility];  Service: Orthopedics;  Laterality: Right;   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 Did downward dog and that was fine,  however still finding balance is a challenge.Patient reports difficulty remembering all 10 sequences of his taekwondo but is not limited by dizziness. Reports lightheadedness which usually happens in the morning.   Pt accompanied by: self  PERTINENT HISTORY: Macular degeneration, THA 2015  PAIN:  Are you having pain? No  PRECAUTIONS: None  RED FLAGS: None   WEIGHT BEARING RESTRICTIONS: No  FALLS: Has patient fallen in last 6 months? No  LIVING ENVIRONMENT: Lives with: lives with their spouse Lives in: House/apartment Stairs:  2 story house; 2 steps to enter   PLOF: Independent- semi-retired: works from [employer], sometimes travels. 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, HEP update  Person educated: Patient Education method: Explanation Education comprehension: verbalized understanding and returned demonstration  HOME EXERCISE PROGRAM Last updated: 02/09/23 Access Code: [email or web address] URL: [email or web address] Date: 02/09/2023 Prepared by: [facility] - Outpatient  Rehab - [facility]  Exercises - Seated Gaze Stabilization with Head Rotation  - 1 x daily - 5 x weekly - 2-3 sets - 30 sec hold - Standing with Head Rotation  - 1 x daily - 5 x weekly - 2-3 sets - 30 sec hold - Seated Gaze Stabilization with Head Nod  - 1 x daily - 5 x weekly - 2-3 sets - 30 sec hold - Standing with Head Nod  -  1 x daily - 5 x weekly - 2-3 sets - 20 sec hold - 1 foot on step + head turns - 1 x daily - 5 x weekly - 2 sets - 30 sec hold    Below measures were taken at time of initial evaluation unless otherwise specified:   DIAGNOSTIC FINDINGS: 12/14/22 head CT:  No acute intracranial abnormality. No specific findings to explain headaches. 2. Moderate chronic small-vessel disease.  Orthostatics negative at neurology visit 01/13/23  COGNITION: Overall cognitive status: Within functional limits for tasks assessed   SENSATION: Pt reports intact sensation in B UEs/LEs   POSTURE:  rounded shoulders and forward head  Cervical ROM:    Active A/PROM (deg) eval  Flexion   Extension   Right lateral flexion   Left lateral flexion   Right rotation   Left rotation   (Blank rows = not tested)   GAIT: Gait pattern: WFL Assistive device utilized: None Level of assistance: Complete Independence   PATIENT SURVEYS:  FOTO 48, 58  VESTIBULAR ASSESSMENT:  GENERAL OBSERVATION: pt wears reading glasses and distance glasses for driving    OCULOMOTOR EXAM:  Ocular Alignment: normal  Ocular ROM:  slightly reduced R eye superior ROM  Spontaneous Nystagmus: absent  Gaze-Induced Nystagmus: absent  Smooth Pursuits: intact  Saccades: intact  Convergence/Divergence: 2 cm    VESTIBULAR - OCULAR REFLEX:   Slow VOR: Normal; c/o delayed slight dizziness   VOR Cancellation: Normal; c/o delayed slight dizziness   Head-Impulse Test: HIT Right: positive HIT Left: positive *c/o delayed dizziness       POSITIONAL TESTING:  Right Roll Test: negative Left Roll Test: negative  Right Sidelying: negative; c/o dizziness upon sitting up Left Sidelying: negative; c/o dizziness upon sitting up     M-CTSIB  Condition 1: Firm Surface, EO 30  Sec, Mild Sway  Condition 2: Firm Surface, EC 30 Sec, Moderate Sway  Condition 3: Foam Surface, EO 30 Sec, Mild Sway  Condition 4: Foam Surface, EC 5 Sec,  Severe Sway     VESTIBULAR TREATMENT:                                                                                                   DATE: 01/21/23    PATIENT EDUCATION: Education details: prognosis, POC, HEP, edu and 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 Hypofunction, vestibular rehab and recovery time, function of VOR, intended level of sx with HEP Person educated: Patient Education method: Explanation, Demonstration, Tactile cues,  Verbal cues, and Handouts Education comprehension: verbalized understanding and returned demonstration  HOME EXERCISE PROGRAM: Access Code: [email or web address] URL: [email or web address] Date: 01/21/2023 Prepared by: [facility] - Outpatient  Rehab - [facility]  Exercises - Seated Gaze Stabilization with Head Rotation  - 1 x daily - 5 x weekly - 2-3 sets - 30 sec hold - Seated Gaze Stabilization with Head Nod  - 1 x daily - 5 x weekly - 2-3 sets - 30 sec hold  GOALS: Goals reviewed with patient? Yes  SHORT TERM GOALS: Target date: 02/11/2023  Patient to be independent with initial HEP. Baseline: HEP initiated Goal status: MET 02/05/23    LONG TERM GOALS: Target date: 03/04/2023  Patient to be independent with advanced HEP. Baseline: Not yet initiated  Goal status: IN PROGRESS  Patient to report 0/10 dizziness with standing vertical and horizontal VOR for 30 seconds. Baseline: Unable Goal status: IN PROGRESS  Patient will report 0/10 dizziness with bed mobility.  Baseline: Symptomatic  Goal status: IN PROGRESS  Patient to maintain balance for M-CTSIB condition with eyes closed/foam surface for 20 sec in order to improve safety in environments with uneven surfaces and dim lighting. Baseline: 5 sec Goal status: IN PROGRESS  Patient to score at least 24/30 on FGA in order to decrease risk of falls. Baseline: 25/30 01/26/24 Goal status: MET  01/26/24  Patient to return to yoga and tai chi  without dizziness limiting.  Baseline: reports dizziness Goal status: IN PROGRESS  Patient to score at least 58 on FOTO in order to indicate improved functional outcomes.  Baseline: 48 Goal status: IN PROGRESS    ASSESSMENT:  CLINICAL IMPRESSION: Patient arrived to session without complaints. Continued working on yoga sequences to habituate movements. Patient reported no dizziness with these exercises. Did require some guarding d/t imbalance and cueing for proper placement of hands/feet. Progressed SLS activities with light UE support on wall for support.  More instability still evident with head turns rather than nods. Patient is progressing well towards goals.   OBJECTIVE IMPAIRMENTS: decreased activity tolerance, decreased balance, and dizziness.   ACTIVITY LIMITATIONS: lifting, bending, standing, squatting, stairs, transfers, bed mobility, bathing, toileting, dressing, reach over head, and hygiene/grooming  PARTICIPATION LIMITATIONS: meal prep, cleaning, laundry, driving, shopping, community activity, occupation, yard work, and church  PERSONAL FACTORS: Age, Fitness, Past/current experiences, Time since onset of injury/illness/exacerbation, and 3+ comorbidities: Macular degeneration, THA 2015  are also affecting patient's functional outcome.   REHAB POTENTIAL: Good  CLINICAL DECISION MAKING: Evolving/moderate complexity  EVALUATION COMPLEXITY: Moderate   PLAN:  PT FREQUENCY: 1-2x/week  PT DURATION: 6 weeks  PLANNED INTERVENTIONS: Therapeutic exercises, Therapeutic activity, Neuromuscular re-education, Balance training, Gait training, Patient/Family education, Self Care, Joint mobilization, Stair training, Vestibular training, Canalith repositioning, Dry Needling, Electrical stimulation, Cryotherapy, Moist heat, Taping, Manual therapy, and Re-evaluation  PLAN FOR NEXT SESSION:progress VOR and work on habituating turns, bending   Anette Guarneri, PT, DPT 02/09/23 2:48  PM  [facility] at [facility] [address] [place], [place] [address] Phone # [phone] Fax # [phone]

## 2023-02-09 ENCOUNTER — Ambulatory Visit: Payer: Medicare PPO | Admitting: Physical Therapy

## 2023-02-09 ENCOUNTER — Encounter: Payer: Self-pay | Admitting: Physical Therapy

## 2023-02-09 DIAGNOSIS — R2681 Unsteadiness on feet: Secondary | ICD-10-CM

## 2023-02-09 DIAGNOSIS — R42 Dizziness and giddiness: Secondary | ICD-10-CM | POA: Diagnosis not present

## 2023-02-10 DIAGNOSIS — D485 Neoplasm of uncertain behavior of skin: Secondary | ICD-10-CM | POA: Diagnosis not present

## 2023-02-10 DIAGNOSIS — L718 Other rosacea: Secondary | ICD-10-CM | POA: Diagnosis not present

## 2023-02-10 DIAGNOSIS — L821 Other seborrheic keratosis: Secondary | ICD-10-CM | POA: Diagnosis not present

## 2023-02-10 DIAGNOSIS — L820 Inflamed seborrheic keratosis: Secondary | ICD-10-CM | POA: Diagnosis not present

## 2023-02-10 NOTE — Therapy (Signed)
OUTPATIENT PHYSICAL THERAPY VESTIBULAR TREATMENT     Patient Name: Clifford Garcia MRN: [id number] DOB:[birth year], 77 y.o., male Today's Date: 02/13/2023  END OF SESSION:  PT End of Session - 02/13/23 1054     Visit Number 7    Number of Visits 13    Date for PT Re-Evaluation 03/04/23    Authorization Type [employer]    Authorization Time Period approved 13 PT visits from 01/21/2023 - 03/04/2023    Authorization - Visit Number 7    Authorization - Number of Visits 13    PT Start Time 1015    PT Stop Time 1058    PT Time Calculation (min) 43 min    Equipment Utilized During Treatment Gait belt    Activity Tolerance Patient tolerated treatment well    Behavior During Therapy WFL for tasks assessed/performed                   Past Medical History:  Diagnosis Date   Arthritis    Eczema    Environmental allergies    History of gout    X 1 EPISODE AGE 47   History of kidney stones    Macular degeneration disease    Past Surgical History:  Procedure Laterality Date   CATARACTS REMOVED     TOTAL HIP ARTHROPLASTY Right 10/18/2013   Procedure: RIGHT TOTAL HIP ARTHROPLASTY ANTERIOR APPROACH;  Surgeon: Shelda Pal, MD;  Location: [facility];  Service: Orthopedics;  Laterality: Right;   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 Have very short moments of dizziness, not much. However feels that he is "lightheaded" in his temples. Worse in the AM and it occurs when he is off balance. Noticing that the biggest weakness is balance. Pt is feeling apprehensive about getting an MRA.   Pt accompanied by: self  PERTINENT HISTORY: Macular degeneration, THA 2015  PAIN:  Are you having pain? No  PRECAUTIONS: None  RED FLAGS: None   WEIGHT BEARING RESTRICTIONS: No  FALLS: Has patient fallen in last 6 months? No  LIVING ENVIRONMENT: Lives with: lives with their spouse Lives in: House/apartment Stairs:  2 story house; 2 steps to enter   PLOF: Independent- semi-retired: works from [employer], sometimes travels. Likes yoga, tai-chi, taekwondo  PATIENT GOALS: improve dizziness   OBJECTIVE:    TODAY'S TREATMENT: 02/13/23 Activity Comments  1/2 tandem EC 2x30" Cueing to shift wt to rebalance, tighten core, widen feet slightly to decrease challenge; using doorway for support occasionally; more difficulty with L foot in front, tendency to fall L  walking EC along counter 6x Come hesitancy   fwd/back stepping with 1 foot on foam  C/o dizziness upon stopping; initially with short hesitant steps that elongated with practice     PATIENT EDUCATION: Education details: discussion on pt's continued symptoms and remaining impairments ;  answered pt's questions on vascular causes of dizziness as pt reports apprehension about getting MRA for this, edu on possible causes of vestibular decompensation  Person educated: Patient Education method: Explanation Education comprehension: verbalized understanding    HOME EXERCISE PROGRAM Access Code: [email or web address] URL: [email or web address] Date: 02/13/2023 Prepared by: [facility] - Outpatient  Rehab - [facility]  Exercises - Seated Gaze Stabilization with Head Rotation  - 1 x daily - 5 x weekly - 2-3 sets - 30 sec hold - Standing with Head  Rotation  - 1 x daily - 5 x weekly - 2-3 sets - 30 sec hold - Seated Gaze Stabilization with Head Nod  - 1 x daily - 5 x weekly - 2-3 sets - 30 sec hold - Standing with Head Nod  - 1 x daily - 5 x weekly - 2-3 sets - 20 sec hold - Standing Toe Taps  - 1 x daily - 5 x weekly - 2 sets - 30 sec hold - Semi-Tandem Balance at [employer] Eyes Closed  - 1 x daily - 5 x weekly - 2 sets - 30 sec hold - Alternating Step Forward with Support  - 1 x daily - 5 x weekly - 2 sets - 10 reps    Below measures were taken at time of initial evaluation unless otherwise specified:   DIAGNOSTIC FINDINGS: 12/14/22 head CT:  No acute intracranial abnormality. No specific findings to explain headaches. 2. Moderate chronic small-vessel disease.  Orthostatics negative at neurology visit 01/13/23  COGNITION: Overall cognitive status: Within functional limits for tasks assessed   SENSATION: Pt reports intact sensation in B UEs/LEs   POSTURE:  rounded shoulders and forward head  Cervical ROM:    Active A/PROM (deg) eval  Flexion   Extension   Right lateral flexion   Left lateral flexion   Right rotation   Left rotation   (Blank rows = not tested)   GAIT: Gait pattern: WFL Assistive device utilized: None Level of assistance: Complete Independence   PATIENT SURVEYS:  FOTO 48, 58  VESTIBULAR ASSESSMENT:  GENERAL OBSERVATION: pt wears reading glasses and distance glasses for driving    OCULOMOTOR EXAM:  Ocular Alignment: normal  Ocular ROM:  slightly reduced R eye superior ROM  Spontaneous Nystagmus: absent  Gaze-Induced Nystagmus: absent  Smooth Pursuits: intact  Saccades: intact  Convergence/Divergence: 2 cm    VESTIBULAR - OCULAR REFLEX:   Slow VOR: Normal; c/o delayed slight dizziness   VOR Cancellation: Normal; c/o delayed slight dizziness   Head-Impulse Test: HIT Right: positive HIT Left: positive *c/o delayed dizziness       POSITIONAL TESTING:  Right Roll Test:  negative Left Roll Test: negative  Right Sidelying: negative; c/o dizziness upon sitting up Left Sidelying: negative; c/o dizziness upon sitting up     M-CTSIB  Condition 1: Firm Surface, EO 30  Sec, Mild Sway  Condition 2: Firm Surface, EC 30 Sec, Moderate Sway  Condition 3: Foam Surface, EO 30 Sec, Mild Sway  Condition 4: Foam Surface, EC 5 Sec, Severe Sway     VESTIBULAR TREATMENT:  DATE: 01/21/23    PATIENT EDUCATION: Education details: prognosis, POC, HEP, edu and 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 Hypofunction, vestibular rehab and recovery time, function of VOR, intended level of sx with HEP Person educated: Patient Education method: Explanation, Demonstration, Tactile cues, Verbal cues, and Handouts Education comprehension: verbalized understanding and returned demonstration  HOME EXERCISE PROGRAM: Access Code: [email or web address] URL: [email or web address] Date: 01/21/2023 Prepared by: [facility] - Outpatient  Rehab - [facility]  Exercises - Seated Gaze Stabilization with Head Rotation  - 1 x daily - 5 x weekly - 2-3 sets - 30 sec hold - Seated Gaze Stabilization with Head Nod  - 1 x daily - 5 x weekly - 2-3 sets - 30 sec hold  GOALS: Goals reviewed with patient? Yes  SHORT TERM GOALS: Target date: 02/11/2023  Patient to be independent with initial HEP. Baseline: HEP initiated Goal status: MET 02/05/23    LONG TERM GOALS: Target date: 03/04/2023  Patient to be independent with advanced HEP. Baseline: Not yet initiated  Goal status: IN PROGRESS  Patient to report 0/10 dizziness with standing vertical and horizontal VOR for 30 seconds. Baseline: Unable Goal status: IN PROGRESS  Patient will report 0/10 dizziness with bed mobility.  Baseline: Symptomatic  Goal status: IN PROGRESS  Patient to maintain balance  for M-CTSIB condition with eyes closed/foam surface for 20 sec in order to improve safety in environments with uneven surfaces and dim lighting. Baseline: 5 sec Goal status: IN PROGRESS  Patient to score at least 24/30 on FGA in order to decrease risk of falls. Baseline: 25/30 01/26/24 Goal status: MET  01/26/24  Patient to return to yoga and tai chi without dizziness limiting.  Baseline: reports dizziness Goal status: IN PROGRESS  Patient to score at least 58 on FOTO in order to indicate improved functional outcomes.  Baseline: 48 Goal status: IN PROGRESS    ASSESSMENT:  CLINICAL IMPRESSION: Patient arrived to session with report of some remaining imbalance. Requesting focus on balance this session. Patient performed multisensory balance exercises with EC, narrow stance, and compliant surface. More difficulty stabilizing with L LE and provided modifications to exercises to improve success with activities. Reports understanding of HEP update and no complaints upon leaving.   OBJECTIVE IMPAIRMENTS: decreased activity tolerance, decreased balance, and dizziness.   ACTIVITY LIMITATIONS: lifting, bending, standing, squatting, stairs, transfers, bed mobility, bathing, toileting, dressing, reach over head, and hygiene/grooming  PARTICIPATION LIMITATIONS: meal prep, cleaning, laundry, driving, shopping, community activity, occupation, yard work, and church  PERSONAL FACTORS: Age, Fitness, Past/current experiences, Time since onset of injury/illness/exacerbation, and 3+ comorbidities: Macular degeneration, THA 2015  are also affecting patient's functional outcome.   REHAB POTENTIAL: Good  CLINICAL DECISION MAKING: Evolving/moderate complexity  EVALUATION COMPLEXITY: Moderate   PLAN:  PT FREQUENCY: 1-2x/week  PT DURATION: 6 weeks  PLANNED INTERVENTIONS: Therapeutic exercises, Therapeutic activity, Neuromuscular re-education, Balance training, Gait training, Patient/Family education,  Self Care, Joint mobilization, Stair training, Vestibular training, Canalith repositioning, Dry Needling, Electrical stimulation, Cryotherapy, Moist heat, Taping, Manual therapy, and Re-evaluation  PLAN FOR NEXT SESSION:progress VOR and work on habituating turns, bending, balance    Anette Guarneri, PT, DPT 02/13/23 11:01 AM  [facility] at [facility] [address], [address] [place], [place] [address] Phone # [phone] Fax # [phone]

## 2023-02-11 DIAGNOSIS — H52223 Regular astigmatism, bilateral: Secondary | ICD-10-CM | POA: Diagnosis not present

## 2023-02-12 ENCOUNTER — Ambulatory Visit: Payer: Medicare PPO

## 2023-02-13 ENCOUNTER — Encounter: Payer: Self-pay | Admitting: Physical Therapy

## 2023-02-13 ENCOUNTER — Ambulatory Visit: Payer: Medicare PPO | Admitting: Physical Therapy

## 2023-02-13 DIAGNOSIS — R2681 Unsteadiness on feet: Secondary | ICD-10-CM

## 2023-02-13 DIAGNOSIS — R42 Dizziness and giddiness: Secondary | ICD-10-CM

## 2023-02-13 NOTE — Therapy (Signed)
OUTPATIENT PHYSICAL THERAPY VESTIBULAR TREATMENT     Patient Name: Clifford Garcia MRN: [id number] DOB:May 08, 1945, 77 y.o., male Today's Date: 02/16/2023  END OF SESSION:  PT End of Session - 02/16/23 1701     Visit Number 8    Number of Visits 13    Date for PT Re-Evaluation 03/04/23    Authorization Type [employer]    Authorization Time Period approved 13 PT visits from 01/21/2023 - 03/04/2023    Authorization - Visit Number 8    Authorization - Number of Visits 13    PT Start Time 1618    PT Stop Time 1659    PT Time Calculation (min) 41 min    Equipment Utilized During Treatment Gait belt    Activity Tolerance Patient tolerated treatment well    Behavior During Therapy WFL for tasks assessed/performed                   Past Medical History:  Diagnosis Date   Arthritis    Eczema    Environmental allergies    History of gout    X 1 EPISODE AGE 52   History of kidney stones    Macular degeneration disease    Past Surgical History:  Procedure Laterality Date   CATARACTS REMOVED     TOTAL HIP ARTHROPLASTY Right 10/18/2013   Procedure: RIGHT TOTAL HIP ARTHROPLASTY ANTERIOR APPROACH;  Surgeon: Shelda Pal, MD;  Location: [facility];  Service: Orthopedics;  Laterality: Right;   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 Had bent forward and did not have any issues.   Pt accompanied by: self  PERTINENT HISTORY: Macular degeneration, THA 2015  PAIN:  Are you having pain? No  PRECAUTIONS: None  RED FLAGS: None   WEIGHT BEARING RESTRICTIONS: No  FALLS: Has patient fallen in last 6 months? No  LIVING ENVIRONMENT: Lives with: lives with their spouse Lives in: House/apartment Stairs:  2 story house; 2 steps to enter   PLOF: Independent- semi-retired: works from [employer], sometimes travels. Likes yoga, tai-chi, taekwondo  PATIENT GOALS: improve dizziness   OBJECTIVE:    TODAY'S TREATMENT: 02/16/23 Activity Comments  wall bumps EO/EC shoulder and hip, then EC on foam  Cues to reduce pace for max control and benefit ; chair in front d/t some apprehension   fwd/back stepping with head turns/nods Able to perform without UE support   toe taps to 3 cones with and without foam Pt fearful on foam- required 1 fingertip support; cueing on wt shifting and encouragement for improved confidence       PATIENT EDUCATION: Education details: edu on vit D and BPPV correlation , HEP update, edu on fear avoidance activities  Person educated: Patient Education method: Explanation, Demonstration, Tactile cues, Verbal cues, and Handouts Education comprehension: verbalized understanding and returned demonstration    HOME EXERCISE PROGRAM Access Code: [email or web address] URL: [email or web address]  Date: 02/16/2023 Prepared by: [facility] - Outpatient  Rehab - [facility]  Exercises - Seated Gaze Stabilization with Head Rotation  - 1 x daily - 5 x weekly - 2-3 sets - 30 sec hold - Standing with Head Rotation  - 1 x daily - 5 x weekly - 2-3 sets - 30 sec hold - Seated Gaze Stabilization with Head Nod  - 1 x daily - 5 x weekly - 2-3 sets - 30 sec hold - Standing with Head Nod  - 1 x daily - 5 x weekly - 2-3 sets - 20 sec hold - Standing Toe Taps  - 1 x daily - 5 x weekly - 2 sets - 30 sec  hold - Semi-Tandem Balance at [employer] Eyes Closed  - 1 x daily - 5 x weekly - 2 sets - 30 sec hold - Alternating Step Forward with Support  - 1 x daily - 5 x weekly - 2 sets - 10 reps - Standing Toe Taps on foam  - 1 x daily - 5 x weekly - 2 sets - 10 reps    Below measures were taken at time of initial evaluation unless otherwise specified:   DIAGNOSTIC FINDINGS: 12/14/22 head CT:  No acute intracranial abnormality. No specific findings to explain headaches. 2. Moderate chronic small-vessel disease.  Orthostatics negative at neurology visit 01/13/23  COGNITION: Overall cognitive status: Within functional limits for tasks assessed   SENSATION: Pt reports intact sensation in B UEs/LEs   POSTURE:  rounded shoulders and forward head  Cervical ROM:    Active A/PROM (deg) eval  Flexion   Extension   Right lateral flexion   Left lateral flexion   Right rotation   Left rotation   (Blank rows = not tested)   GAIT: Gait pattern: WFL Assistive device utilized: None Level of assistance: Complete Independence   PATIENT SURVEYS:  FOTO 48, 58  VESTIBULAR ASSESSMENT:  GENERAL OBSERVATION: pt wears reading glasses and distance glasses for driving    OCULOMOTOR EXAM:  Ocular Alignment: normal  Ocular ROM:  slightly reduced R eye superior ROM  Spontaneous Nystagmus: absent  Gaze-Induced Nystagmus: absent  Smooth Pursuits: intact  Saccades: intact  Convergence/Divergence: 2 cm    VESTIBULAR - OCULAR REFLEX:   Slow VOR: Normal; c/o delayed slight dizziness   VOR Cancellation: Normal; c/o delayed slight dizziness   Head-Impulse Test: HIT Right: positive HIT Left: positive *c/o delayed dizziness       POSITIONAL TESTING:  Right Roll Test: negative Left Roll Test: negative  Right Sidelying: negative; c/o dizziness upon sitting up Left Sidelying: negative; c/o dizziness upon sitting up     M-CTSIB  Condition 1: Firm Surface, EO 30  Sec, Mild Sway  Condition  2: Firm Surface, EC 30 Sec, Moderate Sway  Condition 3: Foam Surface, EO 30 Sec, Mild Sway  Condition 4: Foam Surface, EC 5 Sec, Severe Sway     VESTIBULAR TREATMENT:  DATE: 01/21/23    PATIENT EDUCATION: Education details: prognosis, POC, HEP, edu and 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 Hypofunction, vestibular rehab and recovery time, function of VOR, intended level of sx with HEP Person educated: Patient Education method: Explanation, Demonstration, Tactile cues, Verbal cues, and Handouts Education comprehension: verbalized understanding and returned demonstration  HOME EXERCISE PROGRAM: Access Code: [email or web address] URL: [email or web address] Date: 01/21/2023 Prepared by: [facility] - Outpatient  Rehab - [facility]  Exercises - Seated Gaze Stabilization with Head Rotation  - 1 x daily - 5 x weekly - 2-3 sets - 30 sec hold - Seated Gaze Stabilization with Head Nod  - 1 x daily - 5 x weekly - 2-3 sets - 30 sec hold  GOALS: Goals reviewed with patient? Yes  SHORT TERM GOALS: Target date: 02/11/2023  Patient to be independent with initial HEP. Baseline: HEP initiated Goal status: MET 02/05/23    LONG TERM GOALS: Target date: 03/04/2023  Patient to be independent with advanced HEP. Baseline: Not yet initiated  Goal status: IN PROGRESS  Patient to report 0/10 dizziness with standing vertical and horizontal VOR for 30 seconds. Baseline: Unable Goal status: IN PROGRESS  Patient will report 0/10 dizziness with bed mobility.  Baseline: Symptomatic  Goal status: IN PROGRESS  Patient to maintain balance for M-CTSIB condition with eyes closed/foam surface for 20 sec in order to improve safety in environments with uneven surfaces and dim lighting. Baseline: 5 sec Goal status: IN PROGRESS  Patient to score at least 24/30 on FGA in  order to decrease risk of falls. Baseline: 25/30 01/26/24 Goal status: MET  01/26/24  Patient to return to yoga and tai chi without dizziness limiting.  Baseline: reports dizziness Goal status: IN PROGRESS  Patient to score at least 58 on FOTO in order to indicate improved functional outcomes.  Baseline: 48 Goal status: IN PROGRESS    ASSESSMENT:  CLINICAL IMPRESSION: Patient arrived to session without new complaints. Continued working on balance challenges with multisensory components and SLS. Patient demonstrated improved stability after practice with wall bumps. Quite fearful with SLS activities on foam and tendency to guard considerably, limiting adequate wt shift. Able to perform with limited UE support, thus encouraged to practice this at home for increased confidence. Patient reported understanding and without complaints upon leaving.    OBJECTIVE IMPAIRMENTS: decreased activity tolerance, decreased balance, and dizziness.   ACTIVITY LIMITATIONS: lifting, bending, standing, squatting, stairs, transfers, bed mobility, bathing, toileting, dressing, reach over head, and hygiene/grooming  PARTICIPATION LIMITATIONS: meal prep, cleaning, laundry, driving, shopping, community activity, occupation, yard work, and church  PERSONAL FACTORS: Age, Fitness, Past/current experiences, Time since onset of injury/illness/exacerbation, and 3+ comorbidities: Macular degeneration, THA 2015  are also affecting patient's functional outcome.   REHAB POTENTIAL: Good  CLINICAL DECISION MAKING: Evolving/moderate complexity  EVALUATION COMPLEXITY: Moderate   PLAN:  PT FREQUENCY: 1-2x/week  PT DURATION: 6 weeks  PLANNED INTERVENTIONS: Therapeutic exercises, Therapeutic activity, Neuromuscular re-education, Balance training, Gait training, Patient/Family education, Self Care, Joint mobilization, Stair training, Vestibular training, Canalith repositioning, Dry Needling, Electrical stimulation,  Cryotherapy, Moist heat, Taping, Manual therapy, and Re-evaluation  PLAN FOR NEXT SESSION:progress VOR and work on habituating turns, bending   Anette Guarneri, PT, DPT 02/16/23 5:02 PM  [facility] at [facility] [address] [place], [place] [address] Phone # [phone] Fax # [phone]

## 2023-02-16 ENCOUNTER — Ambulatory Visit: Payer: Medicare PPO | Admitting: Physical Therapy

## 2023-02-16 ENCOUNTER — Encounter: Payer: Self-pay | Admitting: Physical Therapy

## 2023-02-16 DIAGNOSIS — R42 Dizziness and giddiness: Secondary | ICD-10-CM | POA: Diagnosis not present

## 2023-02-16 DIAGNOSIS — R2681 Unsteadiness on feet: Secondary | ICD-10-CM

## 2023-02-17 ENCOUNTER — Ambulatory Visit: Payer: Medicare PPO | Admitting: Physical Therapy

## 2023-02-19 ENCOUNTER — Encounter: Payer: Medicare PPO | Admitting: Physical Therapy

## 2023-02-19 NOTE — Therapy (Signed)
OUTPATIENT PHYSICAL THERAPY VESTIBULAR PROGRESS NOTE/RE-CERT     Patient Name: Clifford Garcia MRN: [id number] DOB:12-31-1945, 77 y.o., male Today's Date: 02/20/2023    Progress Note Reporting Period 01/21/23 to 02/20/23  See note below for Objective Data and Assessment of Progress/Goals.     END OF SESSION:  PT End of Session - 02/20/23 1003     Visit Number 9    Number of Visits 13    Date for PT Re-Evaluation 03/20/23    Authorization Type [employer]    Authorization Time Period approved 13 PT visits from 01/21/2023 - 03/04/2023    Authorization - Visit Number 9    Authorization - Number of Visits 13    PT Start Time 0933    PT Stop Time 1002    PT Time Calculation (min) 29 min    Equipment Utilized During Treatment Gait belt    Activity Tolerance Patient tolerated treatment well    Behavior During Therapy WFL for tasks assessed/performed                    Past Medical History:  Diagnosis Date   Arthritis    Eczema    Environmental allergies    History of gout    X 1 EPISODE AGE 55   History of kidney stones    Macular degeneration disease    Past Surgical History:  Procedure Laterality Date   CATARACTS REMOVED     TOTAL HIP ARTHROPLASTY Right 10/18/2013   Procedure: RIGHT TOTAL HIP ARTHROPLASTY ANTERIOR APPROACH;  Surgeon: Shelda Pal, MD;  Location: [facility];  Service: Orthopedics;  Laterality: Right;   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 Did a new move that involved running and turning quickly. Still having balance challenge with standing kicks in tai chi. Has returned to doing downward dog with head down without issues however was apprehensive trying to sit on a foam roller d/t instability.   Pt accompanied by: self  PERTINENT HISTORY: Macular degeneration, THA 2015  PAIN:  Are you having pain? No  PRECAUTIONS: None  RED FLAGS: None   WEIGHT BEARING RESTRICTIONS: No  FALLS: Has patient fallen in last 6 months? No  LIVING ENVIRONMENT: Lives with: lives with their spouse Lives in: House/apartment Stairs:  2 story house; 2 steps to enter   PLOF: Independent- semi-retired: works from [employer], sometimes travels. Likes yoga, tai-chi, taekwondo  PATIENT GOALS: improve dizziness   OBJECTIVE:     TODAY'S TREATMENT: 02/20/23 Activity Comments  Standing horizontal/vertical VOR 30" 0.5/10 dizziness horizontal and vertical   Simulation of bed mobility 1/10 dizziness R roll ; c/o cloudy upon sitting up form R sidelying   MCTSIB #4 5 sec before LOB  FOTO [phone]            PATIENT EDUCATION: Education details: discussion on objective progress and remaining impairments; POC Person educated: Patient Education  method: Explanation Education comprehension: verbalized understanding   HOME EXERCISE PROGRAM Access Code: [email or web address] URL: [email or web address] Date: 02/16/2023 Prepared by: [facility] - Outpatient  Rehab - [facility]  Exercises - Seated Gaze Stabilization with Head Rotation  - 1 x daily - 5 x weekly - 2-3 sets - 30 sec hold - Standing with Head Rotation  - 1 x daily - 5 x weekly - 2-3 sets - 30 sec hold - Seated Gaze Stabilization with Head Nod  - 1 x daily - 5 x weekly - 2-3 sets - 30 sec hold - Standing with Head Nod  - 1 x daily - 5 x weekly - 2-3  sets - 20 sec hold - Standing Toe Taps  - 1 x daily - 5 x weekly - 2 sets - 30 sec hold - Semi-Tandem Balance at [employer] Eyes Closed  - 1 x daily - 5 x weekly - 2 sets - 30 sec hold - Alternating Step Forward with Support  - 1 x daily - 5 x weekly - 2 sets - 10 reps - Standing Toe Taps on foam  - 1 x daily - 5 x weekly - 2 sets - 10 reps    Below measures were taken at time of initial evaluation unless otherwise specified:   DIAGNOSTIC FINDINGS: 12/14/22 head CT:  No acute intracranial abnormality. No specific findings to explain headaches. 2. Moderate chronic small-vessel disease.  Orthostatics negative at neurology visit 01/13/23  COGNITION: Overall cognitive status: Within functional limits for tasks assessed   SENSATION: Pt reports intact sensation in B UEs/LEs   POSTURE:  rounded shoulders and forward head  Cervical ROM:    Active A/PROM (deg) eval  Flexion   Extension   Right lateral flexion   Left lateral flexion   Right rotation   Left rotation   (Blank rows = not tested)   GAIT: Gait pattern: WFL Assistive device utilized: None Level of assistance: Complete Independence   PATIENT SURVEYS:  FOTO 48, 58  VESTIBULAR ASSESSMENT:  GENERAL OBSERVATION: pt wears reading glasses and distance glasses for driving    OCULOMOTOR EXAM:  Ocular Alignment: normal  Ocular ROM:  slightly reduced R eye superior ROM  Spontaneous Nystagmus: absent  Gaze-Induced Nystagmus: absent  Smooth Pursuits: intact  Saccades: intact  Convergence/Divergence: 2 cm    VESTIBULAR - OCULAR REFLEX:   Slow VOR: Normal; c/o delayed slight dizziness   VOR Cancellation: Normal; c/o delayed slight dizziness   Head-Impulse Test: HIT Right: positive HIT Left: positive *c/o delayed dizziness       POSITIONAL TESTING:  Right Roll Test: negative Left Roll Test: negative  Right Sidelying: negative; c/o dizziness upon sitting up Left Sidelying: negative; c/o dizziness upon  sitting up     M-CTSIB  Condition 1: Firm Surface, EO 30  Sec, Mild Sway  Condition 2: Firm Surface, EC 30 Sec, Moderate Sway  Condition 3: Foam Surface, EO 30 Sec, Mild Sway  Condition 4: Foam Surface, EC 5 Sec, Severe Sway     VESTIBULAR TREATMENT:  DATE: 01/21/23    PATIENT EDUCATION: Education details: prognosis, POC, HEP, edu and 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 Hypofunction, vestibular rehab and recovery time, function of VOR, intended level of sx with HEP Person educated: Patient Education method: Explanation, Demonstration, Tactile cues, Verbal cues, and Handouts Education comprehension: verbalized understanding and returned demonstration  HOME EXERCISE PROGRAM: Access Code: [email or web address] URL: [email or web address] Date: 01/21/2023 Prepared by: [facility] - Outpatient  Rehab - [facility]  Exercises - Seated Gaze Stabilization with Head Rotation  - 1 x daily - 5 x weekly - 2-3 sets - 30 sec hold - Seated Gaze Stabilization with Head Nod  - 1 x daily - 5 x weekly - 2-3 sets - 30 sec hold  GOALS: Goals reviewed with patient? Yes  SHORT TERM GOALS: Target date: 02/11/2023  Patient to be independent with initial HEP. Baseline: HEP initiated Goal status: MET 02/05/23    LONG TERM GOALS: Target date: 03/20/2023  Patient to be independent with advanced HEP. Baseline: Not yet initiated ; reports understanding 02/20/23 Goal status: IN PROGRESS 02/20/23  Patient to report 0/10 dizziness with standing vertical and horizontal VOR for 30 seconds. Baseline: Unable; 0.5/10 dizziness 02/19/24 Goal status: IN PROGRESS 02/20/23  Patient will report 0/10 dizziness with bed mobility.  Baseline: Symptomatic ; 1/10 dizziness 02/20/23 Goal status: IN PROGRESS 02/20/23  Patient to maintain balance for M-CTSIB condition with eyes  closed/foam surface for 20 sec in order to improve safety in environments with uneven surfaces and dim lighting. Baseline: 5 sec; unchanged 02/20/23 Goal status: IN PROGRESS 02/20/23  Patient to score at least 24/30 on FGA in order to decrease risk of falls. Baseline: 25/30 01/26/24 Goal status: MET  01/26/24  Patient to return to yoga and tai chi without dizziness limiting.  Baseline: reports dizziness; reports mild remaining dizziness but has returned to these activities with less modifications 02/19/24 Goal status: IN PROGRESS 02/20/23  Patient to score at least 58 on FOTO in order to indicate improved functional outcomes.  Baseline: 48; 65 02/20/23 Goal status: MET 02/20/23    ASSESSMENT:  CLINICAL IMPRESSION: Patient arrived to session with some remaining dizziness with quick turns and imbalance with SLS tasks in tai chi. Patient reports 0.5/10 dizziness with VOR and 1/10 dizziness with bed mobility. Multisensory balance testing revealed unchanged sway. Has continued yoga and tai chi and reports less modifications required, however still notes some apprehension with certain activities. Patient is progressing well towards goals. Would benefit from additional skilled PT services 1x/week for 4 weeks to address remaining goals.   OBJECTIVE IMPAIRMENTS: decreased activity tolerance, decreased balance, and dizziness.   ACTIVITY LIMITATIONS: lifting, bending, standing, squatting, stairs, transfers, bed mobility, bathing, toileting, dressing, reach over head, and hygiene/grooming  PARTICIPATION LIMITATIONS: meal prep, cleaning, laundry, driving, shopping, community activity, occupation, yard work, and church  PERSONAL FACTORS: Age, Fitness, Past/current experiences, Time since onset of injury/illness/exacerbation, and 3+ comorbidities: Macular degeneration, THA 2015  are also affecting patient's functional outcome.   REHAB POTENTIAL: Good  CLINICAL DECISION MAKING: Evolving/moderate  complexity  EVALUATION COMPLEXITY: Moderate   PLAN:  PT FREQUENCY: 1x/week  PT DURATION: 4 weeks  PLANNED INTERVENTIONS: Therapeutic exercises, Therapeutic activity, Neuromuscular re-education, Balance training, Gait training, Patient/Family education, Self Care, Joint mobilization, Stair training, Vestibular training, Canalith repositioning, Dry Needling, Electrical stimulation, Cryotherapy, Moist heat, Taping, Manual therapy, and Re-evaluation  PLAN FOR NEXT SESSION: practice more challenging tai chi movements- use of foam roller, quick 360 deg turns, SLS balance  Referring diagnosis? R42 (ICD-10-CM) - Positional lightheadedness Treatment diagnosis? (if different than referring diagnosis) Dizziness and giddiness, Unsteadiness on feet What was this (referring dx) caused by? []  Surgery []  Fall []  Ongoing issue []  Arthritis [x]  Other: __insidious__________  Laterality: []  Rt []  Lt [x]  Both  Check all possible CPT codes:  *CHOOSE 10 OR LESS*    []  97110 (Therapeutic Exercise)  []  92507 (SLP Treatment)  []  97112 (Neuro Re-ed)   []  92526 (Swallowing Treatment)   []  97116 (Gait Training)   []  [id number] (Cognitive Training, 1st 15 minutes) []  97140 (Manual Therapy)   []  97130 (Cognitive Training, each add'l 15 minutes)  []  97164 (Re-evaluation)                              []  Other, List CPT Code ____________  []  97530 (Therapeutic Activities)     []  97535 (Self Care)   [x]  All codes above (97110 - 97535)  []  97012 (Mechanical Traction)  []  97014 (E-stim Unattended)  []  97032 (E-stim manual)  []  97033 (Ionto)  []  97035 (Ultrasound) []  97750 (Physical Performance Training) []  [id number] (Aquatic Therapy) []  97016 (Vasopneumatic Device) []  40981 (Paraffin) []  97034 (Contrast Bath) []  97597 (Wound Care 1st 20 sq cm) []  97598 (Wound Care each add'l 20 sq cm) []  97760 (Orthotic Fabrication, Fitting, Training Initial) []  [id number] (Prosthetic Management and Training Initial) []  [id number]  (Orthotic or Prosthetic Training/ Modification Subsequent)    Anette Guarneri, PT, DPT 02/20/23 10:04 AM  [facility] at [facility] [address] [place], [place] [address] Phone # [phone] Fax # [phone]

## 2023-02-20 ENCOUNTER — Ambulatory Visit: Payer: Medicare PPO | Admitting: Physical Therapy

## 2023-02-20 ENCOUNTER — Encounter: Payer: Self-pay | Admitting: Physical Therapy

## 2023-02-20 DIAGNOSIS — R42 Dizziness and giddiness: Secondary | ICD-10-CM | POA: Diagnosis not present

## 2023-02-20 DIAGNOSIS — R2681 Unsteadiness on feet: Secondary | ICD-10-CM | POA: Diagnosis not present

## 2023-02-25 DIAGNOSIS — M9902 Segmental and somatic dysfunction of thoracic region: Secondary | ICD-10-CM | POA: Diagnosis not present

## 2023-02-25 DIAGNOSIS — M9903 Segmental and somatic dysfunction of lumbar region: Secondary | ICD-10-CM | POA: Diagnosis not present

## 2023-02-25 DIAGNOSIS — M9901 Segmental and somatic dysfunction of cervical region: Secondary | ICD-10-CM | POA: Diagnosis not present

## 2023-02-25 NOTE — Therapy (Signed)
OUTPATIENT PHYSICAL THERAPY VESTIBULAR NOTE     Patient Name: Clifford Garcia MRN: [id number] DOB:1946/03/28, 77 y.o., male Today's Date: 02/27/2023     END OF SESSION:  PT End of Session - 02/27/23 1145     Visit Number 10    Number of Visits 13    Date for PT Re-Evaluation 03/20/23    Authorization Type [employer]    Authorization Time Period approved 13 PT visits from 01/21/2023 - 03/04/2023    Authorization - Visit Number 10    Authorization - Number of Visits 13    PT Start Time 1104    PT Stop Time 1144    PT Time Calculation (min) 40 min    Activity Tolerance Patient tolerated treatment well    Behavior During Therapy WFL for tasks assessed/performed                     Past Medical History:  Diagnosis Date   Arthritis    Eczema    Environmental allergies    History of gout    X 1 EPISODE AGE 53   History of kidney stones    Macular degeneration disease    Past Surgical History:  Procedure Laterality Date   CATARACTS REMOVED     TOTAL HIP ARTHROPLASTY Right 10/18/2013   Procedure: RIGHT TOTAL HIP ARTHROPLASTY ANTERIOR APPROACH;  Surgeon: Shelda Pal, MD;  Location: [facility];  Service: Orthopedics;  Laterality: Right;   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 Did better with kicks, turns, and balance activities in his exercise classes.   Pt  accompanied by: self  PERTINENT HISTORY: Macular degeneration, THA 2015  PAIN:  Are you having pain? No  PRECAUTIONS: None  RED FLAGS: None   WEIGHT BEARING RESTRICTIONS: No  FALLS: Has patient fallen in last 6 months? No  LIVING ENVIRONMENT: Lives with: lives with their spouse Lives in: House/apartment Stairs:  2 story house; 2 steps to enter   PLOF: Independent- semi-retired: works from [employer], sometimes travels. Likes yoga, tai-chi, taekwondo  PATIENT GOALS: improve dizziness   OBJECTIVE:     TODAY'S TREATMENT: 02/27/23 Activity Comments  thoracic extension over foam roll Edu on positioning for max comfort   Sitting on foam rolling and rolling out glutes Cues for hand/foot positioning for max stability      laying supine on vertical foam roll + alt UE raise Pt initially very apprehensive- holding breath, moving slow. Improved with practice. Better with practice   laying supine on vertical foam roll + l twist Pt initially very apprehensive- holding breath, moving slow. Improved with practice  taekwondo inspired movements:  fwd/back walking punch  staggered stance punch to multidirectional targets Cues to slow down, loger steps; corrective cues for reciprocal arm/leg movements. Staggered exercises performed near wall for safety   tripod stance, then in eagle pose Mild-mod sway; cues for core contraction  HOME EXERCISE PROGRAM Access Code: [email or web address] URL: [email or web address] Date: 02/16/2023 Prepared by: [facility] - Outpatient  Rehab - [facility]  Exercises - Seated Gaze Stabilization with Head Rotation  - 1 x daily - 5 x weekly - 2-3 sets - 30 sec hold - Standing with Head Rotation  - 1 x daily - 5 x weekly - 2-3 sets - 30 sec hold - Seated Gaze Stabilization with Head Nod  - 1 x daily - 5 x weekly - 2-3 sets - 30 sec hold - Standing with Head Nod  - 1 x daily - 5 x weekly - 2-3 sets - 20 sec hold - Standing Toe Taps  - 1 x daily - 5 x  weekly - 2 sets - 30 sec hold - Semi-Tandem Balance at [employer] Eyes Closed  - 1 x daily - 5 x weekly - 2 sets - 30 sec hold - Alternating Step Forward with Support  - 1 x daily - 5 x weekly - 2 sets - 10 reps - Standing Toe Taps on foam  - 1 x daily - 5 x weekly - 2 sets - 10 reps    Below measures were taken at time of initial evaluation unless otherwise specified:   DIAGNOSTIC FINDINGS: 12/14/22 head CT:  No acute intracranial abnormality. No specific findings to explain headaches. 2. Moderate chronic small-vessel disease.  Orthostatics negative at neurology visit 01/13/23  COGNITION: Overall cognitive status: Within functional limits for tasks assessed   SENSATION: Pt reports intact sensation in B UEs/LEs   POSTURE:  rounded shoulders and forward head  Cervical ROM:    Active A/PROM (deg) eval  Flexion   Extension   Right lateral flexion   Left lateral flexion   Right rotation   Left rotation   (Blank rows = not tested)   GAIT: Gait pattern: WFL Assistive device utilized: None Level of assistance: Complete Independence   PATIENT SURVEYS:  FOTO 48, 58  VESTIBULAR ASSESSMENT:  GENERAL OBSERVATION: pt wears reading glasses and distance glasses for driving    OCULOMOTOR EXAM:  Ocular Alignment: normal  Ocular ROM:  slightly reduced R eye superior ROM  Spontaneous Nystagmus: absent  Gaze-Induced Nystagmus: absent  Smooth Pursuits: intact  Saccades: intact  Convergence/Divergence: 2 cm    VESTIBULAR - OCULAR REFLEX:   Slow VOR: Normal; c/o delayed slight dizziness   VOR Cancellation: Normal; c/o delayed slight dizziness   Head-Impulse Test: HIT Right: positive HIT Left: positive *c/o delayed dizziness       POSITIONAL TESTING:  Right Roll Test: negative Left Roll Test: negative  Right Sidelying: negative; c/o dizziness upon sitting up Left Sidelying: negative; c/o dizziness upon sitting up     M-CTSIB  Condition 1: Firm Surface, EO 30   Sec, Mild Sway  Condition 2: Firm Surface, EC 30 Sec, Moderate Sway  Condition 3: Foam Surface, EO 30 Sec, Mild Sway  Condition 4: Foam Surface, EC 5 Sec, Severe Sway     VESTIBULAR TREATMENT:  DATE: 01/21/23    PATIENT EDUCATION: Education details: prognosis, POC, HEP, edu and 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 Hypofunction, vestibular rehab and recovery time, function of VOR, intended level of sx with HEP Person educated: Patient Education method: Explanation, Demonstration, Tactile cues, Verbal cues, and Handouts Education comprehension: verbalized understanding and returned demonstration  HOME EXERCISE PROGRAM: Access Code: [email or web address] URL: [email or web address] Date: 01/21/2023 Prepared by: [facility] - Outpatient  Rehab - [facility]  Exercises - Seated Gaze Stabilization with Head Rotation  - 1 x daily - 5 x weekly - 2-3 sets - 30 sec hold - Seated Gaze Stabilization with Head Nod  - 1 x daily - 5 x weekly - 2-3 sets - 30 sec hold  GOALS: Goals reviewed with patient? Yes  SHORT TERM GOALS: Target date: 02/11/2023  Patient to be independent with initial HEP. Baseline: HEP initiated Goal status: MET 02/05/23    LONG TERM GOALS: Target date: 03/20/2023  Patient to be independent with advanced HEP. Baseline: Not yet initiated ; reports understanding 02/20/23 Goal status: IN PROGRESS 02/20/23  Patient to report 0/10 dizziness with standing vertical and horizontal VOR for 30 seconds. Baseline: Unable; 0.5/10 dizziness 02/19/24 Goal status: IN PROGRESS 02/20/23  Patient will report 0/10 dizziness with bed mobility.  Baseline: Symptomatic ; 1/10 dizziness 02/20/23 Goal status: IN PROGRESS 02/20/23  Patient to maintain balance for M-CTSIB condition with eyes closed/foam surface for 20 sec in order to improve safety in  environments with uneven surfaces and dim lighting. Baseline: 5 sec; unchanged 02/20/23 Goal status: IN PROGRESS 02/20/23  Patient to score at least 24/30 on FGA in order to decrease risk of falls. Baseline: 25/30 01/26/24 Goal status: MET  01/26/24  Patient to return to yoga and tai chi without dizziness limiting.  Baseline: reports dizziness; reports mild remaining dizziness but has returned to these activities with less modifications 02/19/24 Goal status: IN PROGRESS 02/20/23  Patient to score at least 58 on FOTO in order to indicate improved functional outcomes.  Baseline: 48; 65 02/20/23 Goal status: MET 02/20/23    ASSESSMENT:  CLINICAL IMPRESSION: Patient arrived to session with report of improved balance in his exercise classes lately. Worked on improving stability and balance confidence using foam roll to allow patient to feel more comfortable trying new activities at yoga. Taekwondo type activities incorporated balance tasks with dynamic punching movements. Patient tolerated session well and without complaints upon leaving.   OBJECTIVE IMPAIRMENTS: decreased activity tolerance, decreased balance, and dizziness.   ACTIVITY LIMITATIONS: lifting, bending, standing, squatting, stairs, transfers, bed mobility, bathing, toileting, dressing, reach over head, and hygiene/grooming  PARTICIPATION LIMITATIONS: meal prep, cleaning, laundry, driving, shopping, community activity, occupation, yard work, and church  PERSONAL FACTORS: Age, Fitness, Past/current experiences, Time since onset of injury/illness/exacerbation, and 3+ comorbidities: Macular degeneration, THA 2015  are also affecting patient's functional outcome.   REHAB POTENTIAL: Good  CLINICAL DECISION MAKING: Evolving/moderate complexity  EVALUATION COMPLEXITY: Moderate   PLAN:  PT FREQUENCY: 1x/week  PT DURATION: 4 weeks  PLANNED INTERVENTIONS: Therapeutic exercises, Therapeutic activity, Neuromuscular re-education,  Balance training, Gait training, Patient/Family education, Self Care, Joint mobilization, Stair training, Vestibular training, Canalith repositioning, Dry Needling, Electrical stimulation, Cryotherapy, Moist heat, Taping, Manual therapy, and Re-evaluation  PLAN FOR NEXT SESSION: practice more challenging tai chi movements- use of foam roller, quick 360 deg turns, SLS balance    Anette Guarneri, PT, DPT 02/27/23 11:51 AM  [facility] at [facility] [address] [place],  [address] [place], [place] [address] Phone # [phone] Fax # [phone]

## 2023-02-27 ENCOUNTER — Ambulatory Visit: Payer: Medicare PPO | Attending: Neurology | Admitting: Physical Therapy

## 2023-02-27 ENCOUNTER — Encounter: Payer: Self-pay | Admitting: Physical Therapy

## 2023-02-27 DIAGNOSIS — R2681 Unsteadiness on feet: Secondary | ICD-10-CM | POA: Insufficient documentation

## 2023-02-27 DIAGNOSIS — R42 Dizziness and giddiness: Secondary | ICD-10-CM | POA: Diagnosis not present

## 2023-03-04 NOTE — Therapy (Signed)
OUTPATIENT PHYSICAL THERAPY VESTIBULAR NOTE     Patient Name: Clifford Garcia MRN: [id number] DOB:1945/10/28, 77 y.o., male Today's Date: 03/06/2023     END OF SESSION:  PT End of Session - 03/06/23 1102     Visit Number 11    Number of Visits 13    Date for PT Re-Evaluation 03/20/23    Authorization Type [employer]    Authorization Time Period approved 13 PT visits from 01/21/2023 - 03/04/2023    Authorization - Visit Number 11    Authorization - Number of Visits 13    PT Start Time 1019    PT Stop Time 1100    PT Time Calculation (min) 41 min    Equipment Utilized During Treatment Gait belt    Activity Tolerance Patient tolerated treatment well    Behavior During Therapy WFL for tasks assessed/performed                      Past Medical History:  Diagnosis Date   Arthritis    Eczema    Environmental allergies    History of gout    X 1 EPISODE AGE 21   History of kidney stones    Macular degeneration disease    Past Surgical History:  Procedure Laterality Date   CATARACTS REMOVED     TOTAL HIP ARTHROPLASTY Right 10/18/2013   Procedure: RIGHT TOTAL HIP ARTHROPLASTY ANTERIOR APPROACH;  Surgeon: Shelda Pal, MD;  Location: [facility];  Service: Orthopedics;  Laterality: Right;   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 Noticing movements in tai chi and yoga that he used to have more trouble with are getting easier. Continues to deny dizziness with downward dogs. Reports that UE fatigue is more of a limiting factor in yoga rather than dizziness.   Pt accompanied by: self  PERTINENT HISTORY: Macular degeneration, THA 2015  PAIN:  Are you having pain? No  PRECAUTIONS: None  RED FLAGS: None   WEIGHT BEARING RESTRICTIONS: No  FALLS: Has patient fallen in last 6 months? No  LIVING ENVIRONMENT: Lives with: lives with their spouse Lives in: House/apartment Stairs:  2 story house; 2 steps to enter   PLOF: Independent- semi-retired: works from [employer], sometimes travels. Likes yoga, tai-chi, taekwondo  PATIENT GOALS: improve dizziness   OBJECTIVE:     TODAY'S TREATMENT: 03/06/23 Activity Comments  sitting R/L elbow prop with EC & sitting on dynadisc 2x30" C/o 0-1/10 dizziness;  cues to slow speed  Bird dog 2x10  Cueing for coordination; patient with tendency to significantly rotate spine and hips   STS EO/EC on foam 10x CGA with EC; ankle instability   toe tap to blaze pods on floor with targets close, then far from eachother  Required UE support when trying with SLS on 1 foot at a time; better ability when able to rest feet  each time          HOME EXERCISE PROGRAM Access Code: [email or web address] URL: [email or web address] Date: 03/06/2023 Prepared by: [facility] - Outpatient  Rehab - [facility]  Exercises - Standing Toe Taps  - 1 x daily - 5 x weekly - 2 sets - 30 sec hold - Semi-Tandem Balance at [employer] Eyes Closed  - 1 x daily - 5 x weekly - 2 sets - 30 sec hold - Alternating Step Forward with Support  - 1 x daily - 5 x weekly - 2 sets - 10 reps - Standing Toe Taps  - 1 x daily - 5 x weekly - 2 sets - 10 reps - Bird Dog  - 1 x daily - 5 x weekly - 2 sets - 10 reps   PATIENT EDUCATION: Education details: HEP update/consolidation; answered pt's  questions on returning to PT after end of POC if new issues arise  Person educated: Patient Education method: Explanation, Demonstration, Tactile cues, Verbal cues, and Handouts Education comprehension: verbalized understanding and returned demonstration    Below measures were taken at time of initial evaluation unless otherwise specified:   DIAGNOSTIC FINDINGS: 12/14/22 head CT:  No acute intracranial abnormality. No specific findings to explain headaches. 2. Moderate chronic small-vessel disease.  Orthostatics negative at neurology visit 01/13/23  COGNITION: Overall cognitive status: Within functional limits for tasks assessed   SENSATION: Pt reports intact sensation in B UEs/LEs   POSTURE:  rounded shoulders and forward head  Cervical ROM:    Active A/PROM (deg) eval  Flexion   Extension   Right lateral flexion   Left lateral flexion   Right rotation   Left rotation   (Blank rows = not tested)   GAIT: Gait pattern: WFL Assistive device utilized: None Level of assistance: Complete Independence   PATIENT SURVEYS:  FOTO 48, 58  VESTIBULAR ASSESSMENT:  GENERAL OBSERVATION: pt wears reading glasses and distance glasses for driving    OCULOMOTOR EXAM:  Ocular Alignment: normal  Ocular ROM:  slightly reduced R eye superior ROM  Spontaneous Nystagmus: absent  Gaze-Induced Nystagmus: absent  Smooth Pursuits: intact  Saccades: intact  Convergence/Divergence: 2 cm    VESTIBULAR - OCULAR REFLEX:   Slow VOR: Normal; c/o delayed slight dizziness   VOR Cancellation: Normal; c/o delayed slight dizziness   Head-Impulse Test: HIT Right: positive HIT Left: positive *c/o delayed dizziness       POSITIONAL TESTING:  Right Roll Test: negative Left Roll Test: negative  Right Sidelying: negative; c/o dizziness upon sitting up Left Sidelying: negative; c/o dizziness upon sitting up     M-CTSIB  Condition 1: Firm Surface, EO 30  Sec, Mild Sway  Condition 2:  Firm Surface, EC 30 Sec, Moderate Sway  Condition 3: Foam Surface, EO 30 Sec, Mild Sway  Condition 4: Foam Surface, EC 5 Sec, Severe Sway     VESTIBULAR TREATMENT:                                                                                                   DATE: 01/21/23  PATIENT EDUCATION: Education details: prognosis, POC, HEP, edu and 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 Hypofunction, vestibular rehab and recovery time, function of VOR, intended level of sx with HEP Person educated: Patient Education method: Explanation, Demonstration, Tactile cues, Verbal cues, and Handouts Education comprehension: verbalized understanding and returned demonstration  HOME EXERCISE PROGRAM: Access Code: [email or web address] URL: [email or web address] Date: 01/21/2023 Prepared by: [facility] - Outpatient  Rehab - [facility]  Exercises - Seated Gaze Stabilization with Head Rotation  - 1 x daily - 5 x weekly - 2-3 sets - 30 sec hold - Seated Gaze Stabilization with Head Nod  - 1 x daily - 5 x weekly - 2-3 sets - 30 sec hold  GOALS: Goals reviewed with patient? Yes  SHORT TERM GOALS: Target date: 02/11/2023  Patient to be independent with initial HEP. Baseline: HEP initiated Goal status: MET 02/05/23    LONG TERM GOALS: Target date: 03/20/2023  Patient to be independent with advanced HEP. Baseline: Not yet initiated ; reports understanding 02/20/23 Goal status: IN PROGRESS 02/20/23  Patient to report 0/10 dizziness with standing vertical and horizontal VOR for 30 seconds. Baseline: Unable; 0.5/10 dizziness 02/19/24 Goal status: IN PROGRESS 02/20/23  Patient will report 0/10 dizziness with bed mobility.  Baseline: Symptomatic ; 1/10 dizziness 02/20/23 Goal status: IN PROGRESS 02/20/23  Patient to maintain balance for M-CTSIB condition with eyes closed/foam surface for 20 sec in order to improve safety in environments with uneven  surfaces and dim lighting. Baseline: 5 sec; unchanged 02/20/23 Goal status: IN PROGRESS 02/20/23  Patient to score at least 24/30 on FGA in order to decrease risk of falls. Baseline: 25/30 01/26/24 Goal status: MET  01/26/24  Patient to return to yoga and tai chi without dizziness limiting.  Baseline: reports dizziness; reports mild remaining dizziness but has returned to these activities with less modifications 02/19/24 Goal status: IN PROGRESS 02/20/23  Patient to score at least 58 on FOTO in order to indicate improved functional outcomes.  Baseline: 48; 65 02/20/23 Goal status: MET 02/20/23    ASSESSMENT:  CLINICAL IMPRESSION: Patient arrived to session with report of continued improvement in balance during tai chi and yoga classes. Continues to deny dizziness with downward dogs. Patient performed multisensory balance activities with ankle instability evident but good ability to maintain balance independently. Challenging SLS activities revealed imbalance and need for some UE support, however was able to demonstrate improved success with some modification of this activity. HEP was updated doe max benefit. Pt without complaints upon leaving.   OBJECTIVE IMPAIRMENTS: decreased activity tolerance, decreased balance, and dizziness.   ACTIVITY LIMITATIONS: lifting, bending, standing, squatting, stairs, transfers, bed mobility, bathing, toileting, dressing, reach over head, and hygiene/grooming  PARTICIPATION LIMITATIONS: meal prep, cleaning, laundry, driving, shopping, community activity, occupation, yard work, and church  PERSONAL FACTORS: Age, Fitness, Past/current experiences, Time since onset of injury/illness/exacerbation, and 3+ comorbidities: Macular degeneration, THA 2015  are also affecting patient's functional outcome.   REHAB POTENTIAL: Good  CLINICAL DECISION MAKING: Evolving/moderate complexity  EVALUATION COMPLEXITY: Moderate   PLAN:  PT FREQUENCY: 1x/week  PT  DURATION: 4 weeks  PLANNED INTERVENTIONS: Therapeutic exercises, Therapeutic activity, Neuromuscular re-education, Balance training, Gait training, Patient/Family education, Self Care, Joint mobilization, Stair training, Vestibular training, Canalith repositioning, Dry Needling, Electrical stimulation, Cryotherapy, Moist heat, Taping, Manual therapy, and Re-evaluation  PLAN FOR NEXT SESSION: practice more challenging tai chi movements- use of foam roller, quick 360 deg turns, SLS balance    Anette Guarneri, PT, DPT  03/06/23 11:02 AM  [facility] at [facility] [address] [place], [address] [place], [place] [address] Phone # [phone] Fax # [phone]

## 2023-03-06 ENCOUNTER — Encounter: Payer: Self-pay | Admitting: Physical Therapy

## 2023-03-06 ENCOUNTER — Ambulatory Visit: Payer: Medicare PPO | Admitting: Physical Therapy

## 2023-03-06 DIAGNOSIS — R2681 Unsteadiness on feet: Secondary | ICD-10-CM | POA: Diagnosis not present

## 2023-03-06 DIAGNOSIS — R42 Dizziness and giddiness: Secondary | ICD-10-CM | POA: Diagnosis not present

## 2023-03-11 NOTE — Therapy (Signed)
OUTPATIENT PHYSICAL THERAPY VESTIBULAR NOTE     Patient Name: Clifford Garcia MRN: [id number] DOB:05-09-1945, 77 y.o., male Today's Date: 03/13/2023     END OF SESSION:  PT End of Session - 03/13/23 1102     Visit Number 12    Number of Visits 13    Date for PT Re-Evaluation 03/20/23    Authorization Type [employer]    Authorization Time Period approved 4 PT visits from 10/25/20222-03/20/2023    Authorization - Visit Number 1    Authorization - Number of Visits 4    PT Start Time 1018    PT Stop Time 1100    PT Time Calculation (min) 42 min    Activity Tolerance Patient tolerated treatment well    Behavior During Therapy WFL for tasks assessed/performed                       Past Medical History:  Diagnosis Date   Arthritis    Eczema    Environmental allergies    History of gout    X 1 EPISODE AGE 4   History of kidney stones    Macular degeneration disease    Past Surgical History:  Procedure Laterality Date   CATARACTS REMOVED     TOTAL HIP ARTHROPLASTY Right 10/18/2013   Procedure: RIGHT TOTAL HIP ARTHROPLASTY ANTERIOR APPROACH;  Surgeon: Shelda Pal, MD;  Location: [facility];  Service: Orthopedics;  Laterality: Right;   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 Reports that he continues to work on tai chi, yoga, and  taekwondo. Improving in backwards walking and SLS.   Pt accompanied by: self  PERTINENT HISTORY: Macular degeneration, THA 2015  PAIN:  Are you having pain? Yes: NPRS scale: "mild"/10 Pain location: L shoulder Pain description: sore Aggravating factors: UE movements  Relieving factors: using laser at chiropractor   PRECAUTIONS: None  RED FLAGS: None   WEIGHT BEARING RESTRICTIONS: No  FALLS: Has patient fallen in last 6 months? No  LIVING ENVIRONMENT: Lives with: lives with their spouse Lives in: House/apartment Stairs:  2 story house; 2 steps to enter   PLOF: Independent- semi-retired: works from [employer], sometimes travels. Likes yoga, tai-chi, taekwondo  PATIENT GOALS: improve dizziness   OBJECTIVE:     TODAY'S TREATMENT: 03/13/23 Activity Comments  bird dog 10x  Cueing for opposite arm/leg ; still some instability   Forearm and high planks  Heavy cueing for neutral spine as pt has tendency to hike hips up   Practiced patient's SLS activities from his yoga routines  Occasionally using 1 fingertip support on wall     PATIENT EDUCATION: Education details: answered pt's questions on different types of balance measures and indicators, 3 systems that contribute to balance, edu on vestibular labyrinth anatomy, POC and likely DC next session  Person educated: Patient Education method: Explanation, Demonstration, Tactile cues, and Verbal cues  Education comprehension: verbalized understanding    HOME EXERCISE PROGRAM Access Code: [email or web address] URL: [email or web address] Date: 03/06/2023 Prepared by: [facility] - Outpatient  Rehab - [facility]  Exercises - Standing Toe Taps  - 1 x daily - 5 x weekly - 2 sets - 30 sec hold - Semi-Tandem Balance at Counter Top Eyes Closed  - 1 x daily - 5 x weekly - 2 sets - 30 sec hold - Alternating Step Forward with Support  - 1 x daily - 5 x weekly - 2 sets - 10 reps - Standing Toe Taps  - 1 x daily - 5 x weekly - 2  sets - 10 reps - Bird Dog  - 1 x daily - 5 x weekly - 2 sets - 10 reps    Below measures were taken at time of initial evaluation unless otherwise specified:   DIAGNOSTIC FINDINGS: 12/14/22 head CT:  No acute intracranial abnormality. No specific findings to explain headaches. 2. Moderate chronic small-vessel disease.  Orthostatics negative at neurology visit 01/13/23  COGNITION: Overall cognitive status: Within functional limits for tasks assessed   SENSATION: Pt reports intact sensation in B UEs/LEs   POSTURE:  rounded shoulders and forward head  Cervical ROM:    Active A/PROM (deg) eval  Flexion   Extension   Right lateral flexion   Left lateral flexion   Right rotation   Left rotation   (Blank rows = not tested)   GAIT: Gait pattern: WFL Assistive device utilized: None Level of assistance: Complete Independence   PATIENT SURVEYS:  FOTO 48, 58  VESTIBULAR ASSESSMENT:  GENERAL OBSERVATION: pt wears reading glasses and distance glasses for driving    OCULOMOTOR EXAM:  Ocular Alignment: normal  Ocular ROM:  slightly reduced R eye superior ROM  Spontaneous Nystagmus: absent  Gaze-Induced Nystagmus: absent  Smooth Pursuits: intact  Saccades: intact  Convergence/Divergence: 2 cm    VESTIBULAR - OCULAR REFLEX:   Slow VOR: Normal; c/o delayed slight dizziness   VOR Cancellation: Normal; c/o delayed slight dizziness   Head-Impulse Test: HIT Right: positive HIT Left: positive *c/o delayed dizziness       POSITIONAL TESTING:  Right Roll Test: negative Left Roll Test: negative  Right Sidelying: negative; c/o dizziness upon sitting up Left Sidelying: negative; c/o dizziness upon sitting up     M-CTSIB  Condition 1: Firm Surface, EO 30  Sec, Mild Sway  Condition 2: Firm Surface, EC 30 Sec, Moderate Sway  Condition 3: Foam Surface, EO 30 Sec, Mild Sway  Condition 4: Foam Surface, EC 5 Sec, Severe Sway     VESTIBULAR TREATMENT:                                                                                                    DATE: 01/21/23    PATIENT EDUCATION: Education details: prognosis, POC, HEP, edu and 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 Hypofunction, vestibular rehab and recovery time, function of VOR, intended level of sx with HEP Person educated: Patient Education method: Explanation, Demonstration, Tactile cues, Verbal cues, and Handouts Education  comprehension: verbalized understanding and returned demonstration  HOME EXERCISE PROGRAM: Access Code: [email or web address] URL: [email or web address] Date: 01/21/2023 Prepared by: [facility] - Outpatient  Rehab - [facility]  Exercises - Seated Gaze Stabilization with Head Rotation  - 1 x daily - 5 x weekly - 2-3 sets - 30 sec hold - Seated Gaze Stabilization with Head Nod  - 1 x daily - 5 x weekly - 2-3 sets - 30 sec hold  GOALS: Goals reviewed with patient? Yes  SHORT TERM GOALS: Target date: 02/11/2023  Patient to be independent with initial HEP. Baseline: HEP initiated Goal status: MET 02/05/23    LONG TERM GOALS: Target date: 03/20/2023  Patient to be independent with advanced HEP. Baseline: Not yet initiated ; reports understanding 02/20/23 Goal status: IN PROGRESS 02/20/23  Patient to report 0/10 dizziness with standing vertical and horizontal VOR for 30 seconds. Baseline: Unable; 0.5/10 dizziness 02/19/24 Goal status: IN PROGRESS 02/20/23  Patient will report 0/10 dizziness with bed mobility.  Baseline: Symptomatic ; 1/10 dizziness 02/20/23 Goal status: IN PROGRESS 02/20/23  Patient to maintain balance for M-CTSIB condition with eyes closed/foam surface for 20 sec in order to improve safety in environments with uneven surfaces and dim lighting. Baseline: 5 sec; unchanged 02/20/23 Goal status: IN PROGRESS 02/20/23  Patient to score at least 24/30 on FGA in order to decrease risk of falls. Baseline: 25/30  01/26/24 Goal status: MET  01/26/24  Patient to return to yoga and tai chi without dizziness limiting.  Baseline: reports dizziness; reports mild remaining dizziness but has returned to these activities with less modifications 02/19/24 Goal status: IN PROGRESS 02/20/23  Patient to score at least 58 on FOTO in order to indicate improved functional outcomes.  Baseline: 48; 65 02/20/23 Goal status: MET 02/20/23    ASSESSMENT:  CLINICAL IMPRESSION: Patient arrived to session with report of continued improvement in balance and success in his exercise classes. Reviewed several activities from pt's tai chi and yoga classes and worked on proper form, providing cueing for proper alignment. Answered pt's questions on vestibular rehab. No complaints at end of session.   OBJECTIVE IMPAIRMENTS: decreased activity tolerance, decreased balance, and dizziness.   ACTIVITY LIMITATIONS: lifting, bending, standing, squatting, stairs, transfers, bed mobility, bathing, toileting, dressing, reach over head, and hygiene/grooming  PARTICIPATION LIMITATIONS: meal prep, cleaning, laundry, driving, shopping, community activity, occupation, yard work, and church  PERSONAL FACTORS: Age, Fitness, Past/current experiences, Time since onset of injury/illness/exacerbation, and 3+ comorbidities: Macular degeneration, THA 2015  are also affecting patient's functional outcome.   REHAB POTENTIAL: Good  CLINICAL DECISION MAKING: Evolving/moderate complexity  EVALUATION COMPLEXITY: Moderate   PLAN:  PT FREQUENCY: 1x/week  PT DURATION: 4 weeks  PLANNED INTERVENTIONS: Therapeutic exercises, Therapeutic activity, Neuromuscular re-education, Balance training, Gait training, Patient/Family education, Self Care, Joint mobilization, Stair training, Vestibular training, Canalith repositioning, Dry Needling, Electrical stimulation, Cryotherapy, Moist heat, Taping, Manual therapy, and Re-evaluation  PLAN FOR NEXT SESSION:  practice more challenging tai chi movements- use of foam roller, quick 360 deg turns, SLS balance    Anette Guarneri, PT, DPT 03/13/23 11:03 AM  [facility] at [facility] [address], [address] [place], [place] [address] Phone # [phone] Fax # [phone]

## 2023-03-13 ENCOUNTER — Ambulatory Visit: Payer: Medicare PPO | Admitting: Physical Therapy

## 2023-03-13 ENCOUNTER — Encounter: Payer: Self-pay | Admitting: Physical Therapy

## 2023-03-13 DIAGNOSIS — R42 Dizziness and giddiness: Secondary | ICD-10-CM

## 2023-03-13 DIAGNOSIS — R2681 Unsteadiness on feet: Secondary | ICD-10-CM | POA: Diagnosis not present

## 2023-03-18 DIAGNOSIS — D225 Melanocytic nevi of trunk: Secondary | ICD-10-CM | POA: Diagnosis not present

## 2023-03-18 DIAGNOSIS — L718 Other rosacea: Secondary | ICD-10-CM | POA: Diagnosis not present

## 2023-03-18 DIAGNOSIS — L820 Inflamed seborrheic keratosis: Secondary | ICD-10-CM | POA: Diagnosis not present

## 2023-03-18 NOTE — Therapy (Signed)
OUTPATIENT PHYSICAL THERAPY VESTIBULAR DISCHARGE     Patient Name: Clifford Garcia MRN: [id number] DOB:[birth year], 77 y.o., male Today's Date: 03/20/2023    Progress Note Reporting Period 02/27/23 to 03/20/23  See note below for Objective Data and Assessment of Progress/Goals.     END OF SESSION:  PT End of Session - 03/20/23 1138     Visit Number 13    Number of Visits 13    Date for PT Re-Evaluation 03/20/23    Authorization Type [employer]    Authorization Time Period approved 4 PT visits from 10/25/20222-03/20/2023    Authorization - Visit Number 2    Authorization - Number of Visits 4    PT Start Time 1100    PT Stop Time 1138    PT Time Calculation (min) 38 min    Activity Tolerance Patient tolerated treatment well    Behavior During Therapy WFL for tasks assessed/performed                        Past Medical History:  Diagnosis Date   Arthritis    Eczema    Environmental allergies    History of gout    X 1 EPISODE AGE 34   History of kidney stones    Macular degeneration disease    Past Surgical History:  Procedure Laterality Date   CATARACTS REMOVED     TOTAL HIP ARTHROPLASTY Right 10/18/2013   Procedure: RIGHT TOTAL HIP ARTHROPLASTY ANTERIOR APPROACH;  Surgeon: Shelda Pal, MD;  Location: [facility];  Service: Orthopedics;  Laterality: Right;   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 Reports no dizziness in yoga or tai-chi in the past week.   Pt accompanied by: self  PERTINENT HISTORY: Macular degeneration, THA 2015  PAIN:  Are you having pain? Yes: NPRS scale: "mild"/10 Pain location: L shoulder Pain description: sore Aggravating factors: UE movements  Relieving factors: using laser at chiropractor   PRECAUTIONS: None  RED FLAGS: None   WEIGHT BEARING RESTRICTIONS: No  FALLS: Has patient fallen in last 6 months? No  LIVING ENVIRONMENT: Lives with: lives with their spouse Lives in: House/apartment Stairs:  2 story house; 2 steps to enter   PLOF: Independent- semi-retired: works from [employer], sometimes travels. Likes yoga, tai-chi, taekwondo  PATIENT GOALS: improve dizziness   OBJECTIVE:     TODAY'S TREATMENT: 03/20/23 Activity Comments  Horizontal and vertical VOR 30" each 0/10 dizziness   Bed mobility  "0.2/10" dizziness upon sitting up  MCTSIB #4 Required several tried; 7 sec max before LOB  FOTO  [phone]          PATIENT EDUCATION: Education details: answered pt's questions on vestibular anatomy, edu on progress towards goals and remaining impairments, edu on returning with new referral if symptoms return; reviewed HEP Person educated: Patient Education method: Explanation Education comprehension: verbalized understanding  HOME EXERCISE PROGRAM Access Code: [email or web address] URL: [email or web address] Date: 03/06/2023 Prepared by: [facility] - Outpatient  Rehab - [facility]  Exercises - Standing Toe Taps  - 1 x daily - 5 x weekly - 2 sets - 30 sec hold - Semi-Tandem Balance at Counter Top Eyes Closed  - 1 x daily - 5 x weekly - 2 sets - 30 sec hold - Alternating Step Forward with Support  - 1 x daily - 5 x weekly - 2 sets - 10 reps - Standing Toe Taps  - 1 x daily - 5 x weekly - 2 sets - 10 reps - Bird Dog  - 1 x daily - 5 x weekly - 2 sets -  10 reps    Below measures were taken at time of initial evaluation unless otherwise specified:   DIAGNOSTIC FINDINGS: 12/14/22 head CT:  No acute intracranial abnormality. No specific findings to explain headaches. 2. Moderate chronic small-vessel disease.  Orthostatics negative at neurology visit 01/13/23  COGNITION: Overall cognitive status: Within functional limits for tasks assessed   SENSATION: Pt reports intact sensation in B UEs/LEs   POSTURE:  rounded shoulders and forward head  Cervical ROM:    Active A/PROM (deg) eval  Flexion   Extension   Right lateral flexion   Left lateral flexion   Right rotation   Left rotation   (Blank rows = not tested)   GAIT: Gait pattern: WFL Assistive device utilized: None Level of assistance: Complete Independence   PATIENT SURVEYS:  FOTO 48, 58  VESTIBULAR ASSESSMENT:  GENERAL OBSERVATION: pt wears reading glasses and distance glasses for driving    OCULOMOTOR EXAM:  Ocular Alignment: normal  Ocular ROM:  slightly reduced R eye superior ROM  Spontaneous Nystagmus: absent  Gaze-Induced Nystagmus: absent  Smooth Pursuits: intact  Saccades: intact  Convergence/Divergence: 2 cm    VESTIBULAR - OCULAR REFLEX:   Slow VOR: Normal; c/o delayed slight dizziness   VOR Cancellation: Normal; c/o delayed slight dizziness   Head-Impulse Test: HIT Right: positive HIT Left: positive *c/o delayed dizziness       POSITIONAL TESTING:  Right Roll Test: negative Left Roll Test: negative  Right Sidelying: negative; c/o dizziness upon sitting up Left Sidelying: negative; c/o dizziness upon sitting up     M-CTSIB  Condition 1: Firm Surface, EO 30  Sec, Mild Sway  Condition 2: Firm Surface, EC 30 Sec, Moderate Sway  Condition 3: Foam Surface, EO 30 Sec, Mild Sway  Condition 4: Foam Surface, EC 5 Sec, Severe Sway     VESTIBULAR TREATMENT:                                                                                                    DATE: 01/21/23    PATIENT EDUCATION: Education details: prognosis, POC, HEP, edu and 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 Hypofunction, vestibular rehab and recovery time, function of VOR, intended level of sx with HEP Person educated: Patient Education method: Explanation, Demonstration, Tactile cues, Verbal cues, and Handouts Education comprehension: verbalized understanding and returned demonstration  HOME EXERCISE PROGRAM: Access Code: [email or web address] URL: [email or web address] Date: 01/21/2023 Prepared by: [facility] - Outpatient  Rehab - [facility]  Exercises - Seated Gaze Stabilization with Head Rotation  - 1 x daily - 5 x weekly - 2-3 sets - 30 sec hold - Seated Gaze Stabilization with Head Nod  - 1 x daily - 5 x weekly - 2-3 sets - 30 sec hold  GOALS: Goals reviewed with patient? Yes  SHORT TERM GOALS: Target date: 02/11/2023  Patient to be independent with initial HEP. Baseline: HEP initiated Goal status: MET 02/05/23    LONG TERM GOALS: Target date: 03/20/2023  Patient to be independent with advanced HEP. Baseline: Not yet initiated ; reports understanding 02/20/23 Goal status: MET 03/20/23  Patient to report 0/10 dizziness with standing vertical and horizontal VOR for 30 seconds. Baseline: Unable; 0.5/10 dizziness 02/19/24; 0/10 03/20/23 Goal status: MET 03/20/23  Patient will report 0/10 dizziness with bed mobility.  Baseline: Symptomatic ; 1/10 dizziness 02/20/23; 0.2/10 dizziness  03/20/23 Goal status: PARTIALLY MET 03/20/23  Patient to maintain balance for M-CTSIB condition with eyes closed/foam surface for 20 sec in order to improve safety in environments with uneven surfaces and dim lighting. Baseline: 5 sec; unchanged 02/20/23; 7 sec 03/20/23 Goal status: NOT MET 03/20/23  Patient to score at least 24/30 on FGA in order to decrease risk of falls. Baseline: 25/30 01/26/24 Goal status: MET   01/26/24  Patient to return to yoga and tai chi without dizziness limiting.  Baseline: reports dizziness; reports mild remaining dizziness but has returned to these activities with less modifications 02/19/24; no dizziness 03/20/23 Goal status: MET 03/20/23  Patient to score at least 58 on FOTO in order to indicate improved functional outcomes.  Baseline: 48; 65 02/20/23 Goal status: MET 02/20/23    ASSESSMENT:  CLINICAL IMPRESSION: Patient arrived to session without complaints. Reports no dizziness in yoga or tai-chi in the past week. Patient reports 0/10 dizziness with VOR and "0.2/10 dizziness" with bed mobility. Multisensory balance testing revealed remaining imbalance with EC/on foam, as expected for B vestibular hypofunction. Patient has demonstrated significant progress towards therapy goals and is independent with HEP. Has returned to fitness classes without symptoms. Ready for DC at this time.   OBJECTIVE IMPAIRMENTS: decreased activity tolerance, decreased balance, and dizziness.   ACTIVITY LIMITATIONS: lifting, bending, standing, squatting, stairs, transfers, bed mobility, bathing, toileting, dressing, reach over head, and hygiene/grooming  PARTICIPATION LIMITATIONS: meal prep, cleaning, laundry, driving, shopping, community activity, occupation, yard work, and church  PERSONAL FACTORS: Age, Fitness, Past/current experiences, Time since onset of injury/illness/exacerbation, and 3+ comorbidities: Macular degeneration, THA 2015  are also affecting patient's functional outcome.   REHAB POTENTIAL: Good  CLINICAL DECISION MAKING: Evolving/moderate complexity  EVALUATION COMPLEXITY: Moderate   PLAN:  PT FREQUENCY: 1x/week  PT DURATION: 4 weeks  PLANNED INTERVENTIONS: Therapeutic exercises, Therapeutic activity, Neuromuscular re-education, Balance training, Gait training, Patient/Family education, Self Care, Joint mobilization, Stair training, Vestibular training, Canalith  repositioning, Dry Needling, Electrical stimulation, Cryotherapy, Moist heat, Taping, Manual therapy, and Re-evaluation  PLAN FOR NEXT SESSION: DC at this time   PHYSICAL THERAPY DISCHARGE SUMMARY  Visits from Start of Care: 13  Current functional level related to goals / functional outcomes: See above clinical impression   Remaining deficits: Imbalance on foam/EC, very slight dizziness with bed mobility    Education / Equipment: HEP  Plan: Patient agrees to discharge.  Patient goals were partially met. Patient is being discharged due to meeting the stated rehab goals  and satisfaction with CLOF.          Anette Guarneri, PT, DPT 03/20/23 11:39 AM  [facility] at [facility] [place] [address] [place], [address] [place], [place] [address] Phone # [phone] Fax # [phone]

## 2023-03-20 ENCOUNTER — Ambulatory Visit: Payer: Medicare PPO | Admitting: Physical Therapy

## 2023-03-20 ENCOUNTER — Encounter: Payer: Self-pay | Admitting: Physical Therapy

## 2023-03-20 DIAGNOSIS — R42 Dizziness and giddiness: Secondary | ICD-10-CM

## 2023-03-20 DIAGNOSIS — M9901 Segmental and somatic dysfunction of cervical region: Secondary | ICD-10-CM | POA: Diagnosis not present

## 2023-03-20 DIAGNOSIS — M9902 Segmental and somatic dysfunction of thoracic region: Secondary | ICD-10-CM | POA: Diagnosis not present

## 2023-03-20 DIAGNOSIS — R2681 Unsteadiness on feet: Secondary | ICD-10-CM

## 2023-03-20 DIAGNOSIS — M9903 Segmental and somatic dysfunction of lumbar region: Secondary | ICD-10-CM | POA: Diagnosis not present

## 2023-04-03 DIAGNOSIS — M9902 Segmental and somatic dysfunction of thoracic region: Secondary | ICD-10-CM | POA: Diagnosis not present

## 2023-04-03 DIAGNOSIS — M9901 Segmental and somatic dysfunction of cervical region: Secondary | ICD-10-CM | POA: Diagnosis not present

## 2023-04-03 DIAGNOSIS — M9903 Segmental and somatic dysfunction of lumbar region: Secondary | ICD-10-CM | POA: Diagnosis not present

## 2023-04-06 DIAGNOSIS — M9901 Segmental and somatic dysfunction of cervical region: Secondary | ICD-10-CM | POA: Diagnosis not present

## 2023-04-06 DIAGNOSIS — M9903 Segmental and somatic dysfunction of lumbar region: Secondary | ICD-10-CM | POA: Diagnosis not present

## 2023-04-06 DIAGNOSIS — M9902 Segmental and somatic dysfunction of thoracic region: Secondary | ICD-10-CM | POA: Diagnosis not present

## 2023-04-08 DIAGNOSIS — M9901 Segmental and somatic dysfunction of cervical region: Secondary | ICD-10-CM | POA: Diagnosis not present

## 2023-04-08 DIAGNOSIS — M9903 Segmental and somatic dysfunction of lumbar region: Secondary | ICD-10-CM | POA: Diagnosis not present

## 2023-04-08 DIAGNOSIS — M9902 Segmental and somatic dysfunction of thoracic region: Secondary | ICD-10-CM | POA: Diagnosis not present

## 2023-04-10 DIAGNOSIS — M9902 Segmental and somatic dysfunction of thoracic region: Secondary | ICD-10-CM | POA: Diagnosis not present

## 2023-04-10 DIAGNOSIS — M9901 Segmental and somatic dysfunction of cervical region: Secondary | ICD-10-CM | POA: Diagnosis not present

## 2023-04-10 DIAGNOSIS — M9903 Segmental and somatic dysfunction of lumbar region: Secondary | ICD-10-CM | POA: Diagnosis not present

## 2023-04-15 ENCOUNTER — Encounter: Payer: Self-pay | Admitting: Neurology

## 2023-04-15 ENCOUNTER — Ambulatory Visit: Payer: Medicare PPO | Admitting: Neurology

## 2023-04-15 VITALS — BP 137/78 | HR 92 | Ht 69.0 in | Wt 181.6 lb

## 2023-04-15 DIAGNOSIS — H832X3 Labyrinthine dysfunction, bilateral: Secondary | ICD-10-CM | POA: Diagnosis not present

## 2023-04-15 DIAGNOSIS — G629 Polyneuropathy, unspecified: Secondary | ICD-10-CM

## 2023-04-15 DIAGNOSIS — M9901 Segmental and somatic dysfunction of cervical region: Secondary | ICD-10-CM | POA: Diagnosis not present

## 2023-04-15 DIAGNOSIS — M9902 Segmental and somatic dysfunction of thoracic region: Secondary | ICD-10-CM | POA: Diagnosis not present

## 2023-04-15 DIAGNOSIS — M9903 Segmental and somatic dysfunction of lumbar region: Secondary | ICD-10-CM | POA: Diagnosis not present

## 2023-04-15 NOTE — Progress Notes (Unsigned)
NEUROLOGY FOLLOW UP OFFICE NOTE  Clifford Garcia [id number] 11-28-1945  HISTORY OF PRESENT ILLNESS: I had the pleasure of seeing Clifford Garcia in follow-up in the neurology clinic on 04/15/2023.  The patient was last seen 3 months ago for dizziness. He is alone in the office today. Records and images were personally reviewed where available.  Since his last visit, he has completed Vestibular therapy with report of 0/10 dizziness with VOR and "0.2/10 dizziness with bed mobility. There was still remaining imbalance expected for bilateral vestibular hypofunction noted but he had significant progress with therapy goals and has returned to fitness classes..  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 He recalls having an episode 4 years ago where he felt dizzy/lightheaded when he woke up but still went to Taekwondo. He was doing an exercise using a stick, turned and lost his balance, falling and fracturing a rib. The dizziness quieted down until July 2024 when again at Taekwondo while doing their warmup, he felt lightheaded and had to stop. He has not been back since then. There is a positional component to his symptoms. He is fine when supine. When he does the downward dog position in Yoga, he feels the lightheaded sensation. His instructor put blocks to support his neck and he was able to do the posture without symptoms. Another time it occurs is when he was doing a 360 degree spin in Taekwondo. He does not have any problems when doing Tai Chi. He recently went on a yoga trip to [place] and after being on a bus going down a narrow  winding road, he was off balance. He found that sitting at the front with his feet planted on the ground helped. When he wakes up in the morning, he puts both feet on the ground before standing and does fine 90% of the time. He was given a prescription for Meclizine and notes he seemed to have less symptoms when taking it but not an unbelievable difference. He denies any spinning sensation. No headaches, double vision, difficulty swallowing, nausea/vomiting, neck/back pain, focal numbness/tingling/weakness, bowel/bladder dysfunction. 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  I personally reviewed head CT without contrast done 11/2022 which did not show any acute changes. 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    PAST MEDICAL HISTORY: Past Medical History:  Diagnosis Date   Arthritis    Eczema    Environmental allergies    History of gout    X 1 EPISODE AGE 77   History of kidney stones    Macular degeneration disease     MEDICATIONS: Current Outpatient Medications on File Prior to Visit  Medication Sig Dispense Refill   desonide (DESOWEN) 0.05 % cream      No current facility-administered medications on file prior to visit.    ALLERGIES: Allergies  Allergen Reactions   Tetracycline Hcl Diarrhea    Bloated stomach    Tetracyclines & Related Diarrhea    Bloated stomach     FAMILY HISTORY: Family History  Problem  Relation Age of Onset   CAD Father    Spina bifida Sister    Leukemia Brother    Dementia Brother    Healthy Daughter     SOCIAL HISTORY: Social History   Socioeconomic History   Marital status: Significant Other    Spouse name: Not on file   Number of children: 2   Years of education: Not on file   Highest education level: Doctorate  Occupational History   Not on file  Tobacco Use   Smoking status: Former    Current packs/day:  0.00    Types: Cigarettes    Quit date: 10/13/1984    Years since quitting: 38.5   Smokeless tobacco: Never  Vaping Use   Vaping status: Never Used  Substance and Sexual Activity   Alcohol use: Yes    Comment: White wine 1x week   Drug use: Never   Sexual activity: Yes  Other Topics Concern   Not on file  Social History Narrative   Are you right handed or left handed? right   Are you currently employed ? 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 Partner    What type of home do you live in: 1 story or 2 story? 2 story 15 steps       Social Drivers of Corporate investment banker Strain: Low Risk  (02/04/2023)   Overall Financial Resource Strain (CARDIA)    Difficulty of Paying Living Expenses: Not hard at all  Food Insecurity: No Food Insecurity (02/04/2023)   Hunger Vital Sign    Worried About Running Out of Food in the Last Year: Never true    Ran Out of Food in the Last Year: Never true  Transportation Needs: No Transportation Needs (02/04/2023)   PRAPARE - Administrator, Civil Service (Medical): No    Lack of Transportation (Non-Medical): No  Physical Activity: Sufficiently Active (02/04/2023)   Exercise Vital Sign    Days of Exercise per Week: 4 days    Minutes of Exercise per Session: 50 min  Stress: No Stress Concern Present (02/04/2023)   [employer] of Occupational Health - Occupational Stress Questionnaire    Feeling of Stress : Not at all  Social Connections: Socially Integrated (02/04/2023)   Social Connection and Isolation Panel [NHANES]    Frequency of Communication with Friends and Family: More than three times a week    Frequency of Social Gatherings with Friends and Family: Once a week    Attends Religious Services: 1 to 4 times per year    Active Member of [employer] or Organizations: Yes    Attends Banker Meetings: 1 to 4 times per year     Marital Status: Living with partner  Intimate Partner Violence: Not At Risk (02/04/2023)   Humiliation, Afraid, Rape, and Kick questionnaire    Fear of Current or Ex-Partner: No    Emotionally Abused: No    Physically Abused: No    Sexually Abused: No     PHYSICAL EXAM: Vitals:   04/15/23 1423  BP: 137/78  Pulse: 92  SpO2: 98%   General: No acute distress Head:  Normocephalic/atraumatic Skin/Extremities: No rash, no edema Neurological Exam: alert and oriented to person, place, and time. No aphasia or dysarthria. Fund of knowledge is appropriate.  Recent and remote memory are intact.  Attention and  concentration are normal.   Cranial nerves: Pupils equal, round. Extraocular movements intact with no nystagmus. Visual fields full.  No facial asymmetry.  Motor: Bulk and tone normal, muscle strength 5/5 throughout with no pronator drift.   Finger to nose testing intact.  Gait narrow-based and steady, able to tandem walk adequately.  Romberg negative.   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 He denies any spinning sensation but notes a clear positional component with a sensation of lightheadedness with certain changes in position. No signficant orthostasis in office today. His neurological exam shows a length-dependent neuropathy which can cause balance changes, however etiology of positional dizziness unclear. Possibly vestibular, however we will do vascular imaging to rule out vertebrobasilar insufficiency. He will be referred for Vestibular therapy. Neuropathy labs will be ordered. All his questions were answered to the best of my abilities. Follow-up in 3-4 months, call for any changes.     Thank you for allowing me to participate in *** care.  Please do not hesitate to call for any questions or concerns.  The duration of this appointment visit was *** minutes of face-to-face time  with the patient.  Greater than 50% of this time was spent in counseling, explanation of diagnosis, planning of further management, and coordination of care.   Patrcia Dolly, M.D.   CC: ***

## 2023-04-15 NOTE — Patient Instructions (Signed)
Great to see you doing well! Continue home exercises. I wish you all the best! See you in 6 months, call for any changes

## 2023-04-27 DIAGNOSIS — M9901 Segmental and somatic dysfunction of cervical region: Secondary | ICD-10-CM | POA: Diagnosis not present

## 2023-04-27 DIAGNOSIS — M9903 Segmental and somatic dysfunction of lumbar region: Secondary | ICD-10-CM | POA: Diagnosis not present

## 2023-04-27 DIAGNOSIS — M9902 Segmental and somatic dysfunction of thoracic region: Secondary | ICD-10-CM | POA: Diagnosis not present

## 2023-05-13 DIAGNOSIS — M9903 Segmental and somatic dysfunction of lumbar region: Secondary | ICD-10-CM | POA: Diagnosis not present

## 2023-05-13 DIAGNOSIS — M9902 Segmental and somatic dysfunction of thoracic region: Secondary | ICD-10-CM | POA: Diagnosis not present

## 2023-05-13 DIAGNOSIS — M9901 Segmental and somatic dysfunction of cervical region: Secondary | ICD-10-CM | POA: Diagnosis not present

## 2023-05-29 DIAGNOSIS — M9903 Segmental and somatic dysfunction of lumbar region: Secondary | ICD-10-CM | POA: Diagnosis not present

## 2023-05-29 DIAGNOSIS — M9901 Segmental and somatic dysfunction of cervical region: Secondary | ICD-10-CM | POA: Diagnosis not present

## 2023-05-29 DIAGNOSIS — M9902 Segmental and somatic dysfunction of thoracic region: Secondary | ICD-10-CM | POA: Diagnosis not present

## 2023-06-09 DIAGNOSIS — H906 Mixed conductive and sensorineural hearing loss, bilateral: Secondary | ICD-10-CM | POA: Diagnosis not present

## 2023-06-17 DIAGNOSIS — M1611 Unilateral primary osteoarthritis, right hip: Secondary | ICD-10-CM | POA: Diagnosis not present

## 2023-07-30 ENCOUNTER — Ambulatory Visit: Payer: Self-pay

## 2023-07-30 NOTE — Telephone Encounter (Signed)
   Chief Complaint: index finger pain  Symptoms: bruising  [] ED /[] Urgent Care (no appt availability in office) / [x] Appointment(In office/virtual)/ []  [facility]/ [] Home Care/ [] Refused Recommended Disposition /[]  Mobile Bus/ []  Follow-up with PCP Additional Notes: Pt calling with left index finger tip bruising/pain. Pt stated no injury. Pt stated it's only painful if pressed. Pt denied weakness. Some swelling. Pt also has pain concerns with both feet. Right foot hurts below toes and whole left foot hurts. Pt denies any discoloration with feet. Pt wonders if Gout. Pt had case of Gout nearly 40 years ago.  Pt has appt tomorrow at 0800 with Dr [place]. RN gave care advice and pt verbalized understanding.              Copied from CRM [id number]. Topic: Clinical - Red Word Triage >> Jul 30, 2023 12:18 PM Gurney Maxin H wrote: [facility] that prompted transfer to Nurse Triage: Swollen finger tip left index finger has turned purple, pain to the touch Reason for Disposition  [1] MODERATE pain (e.g., interferes with normal activities) AND [2] present > 3 days  Answer Assessment - Initial Assessment Questions 1. ONSET: "When did the pain start?"      Yesterday  2. LOCATION and RADIATION: "Where is the pain located?"  (e.g., fingertip, around nail, joint, entire  finger)      Left index finger  3. SEVERITY: "How bad is the pain?" "What does it keep you from doing?"   (Scale 1-10; or mild, moderate, severe)  - MILD (1-3): doesn't interfere with normal activities.   - MODERATE (4-7): interferes with normal activities or awakens from sleep.  - SEVERE (8-10): excruciating pain, unable to hold a glass of water or bend finger even a little.     Mild  4. APPEARANCE: "What does the finger look like?" (e.g., redness, swelling, bruising, pallor)     Dark purple on tip 5. WORK OR EXERCISE: "Has there been any recent work or exercise that involved this part (i.e., fingers or hand) of the  body?"     Denies  6. CAUSE: "What do you think is causing the pain?"     Not sure  7. AGGRAVATING FACTORS: "What makes the pain worse?" (e.g., using computer)     Pressing  8. OTHER SYMPTOMS: "Do you have any other symptoms?" (e.g., fever, neck pain, numbness)     denies  Protocols used: Finger Pain-A-AH

## 2023-07-31 ENCOUNTER — Encounter: Payer: Self-pay | Admitting: Family Medicine

## 2023-07-31 ENCOUNTER — Ambulatory Visit: Admitting: Family Medicine

## 2023-07-31 VITALS — BP 116/64 | HR 76 | Temp 98.0°F | Resp 16 | Ht 69.0 in | Wt 175.0 lb

## 2023-07-31 DIAGNOSIS — H903 Sensorineural hearing loss, bilateral: Secondary | ICD-10-CM | POA: Diagnosis not present

## 2023-07-31 DIAGNOSIS — L03019 Cellulitis of unspecified finger: Secondary | ICD-10-CM | POA: Diagnosis not present

## 2023-07-31 MED ORDER — SULFAMETHOXAZOLE-TRIMETHOPRIM 800-160 MG PO TABS
1.0000 | ORAL_TABLET | Freq: Two times a day (BID) | ORAL | 0 refills | Status: AC
Start: 2023-07-31 — End: 2023-08-05

## 2023-07-31 NOTE — Progress Notes (Signed)
 ACUTE VISIT Chief Complaint  Patient presents with   Hand Pain    Top of left index finger   HPI: [name] is a 78 y.o. male with a PMHx significant for sensorineural hearing loss, OA, and HLD, who is here today with above complaint.  Patient complains of pain, swelling, and bruising in his left index finger since 4/2. He rates the pain as a 5/10 with movement and a 2/10 when still and says it is constant. He has not had this problem before.  Initially edema, erythema, and pain was affecting the whole finger but now it is limited to distal area.  He had a gout attack in his big toe approximately 40 years ago. Negative for changes in dietary habits, trauma, or unusual activity.  Pertinent negatives include fever, chills, changes in appetite, finger numbness, tingling, or weakness. Negative for chest pain, SOB, palpitations, body aches, cyanosis, or feeling ill. He has not tried any OTC medications.  Review of Systems  Constitutional:  Negative for activity change, chills and unexpected weight change.  HENT:  Negative for hearing loss and sore throat.   Gastrointestinal:  Negative for abdominal pain and nausea.  Genitourinary:  Negative for decreased urine volume and hematuria.  Skin:  Negative for rash.  Neurological:  Negative for syncope, facial asymmetry and headaches.  Psychiatric/Behavioral:  Negative for confusion and hallucinations.   See other pertinent positives and negatives in HPI.  Current Outpatient Medications on File Prior to Visit  Medication Sig Dispense Refill   desonide (DESOWEN) 0.05 % cream      No current facility-administered medications on file prior to visit.    Past Medical History:  Diagnosis Date   Arthritis    Eczema    Environmental allergies    History of gout    X 1 EPISODE AGE 75   History of kidney stones    Macular degeneration disease    Allergies  Allergen Reactions   Tetracycline Hcl Diarrhea    Bloated stomach     Tetracyclines & Related Diarrhea    Bloated stomach     Social History   Socioeconomic History   Marital status: Significant Other    Spouse name: Not on file   Number of children: 2   Years of education: Not on file   Highest education level: Doctorate  Occupational History   Not on file  Tobacco Use   Smoking status: Former    Current packs/day: 0.00    Types: Cigarettes    Quit date: 10/13/1984    Years since quitting: 38.8   Smokeless tobacco: Never  Vaping Use   Vaping status: Never Used  Substance and Sexual Activity   Alcohol use: Yes    Comment: White wine 1x week   Drug use: Never   Sexual activity: Yes  Other Topics Concern   Not on file  Social History Narrative   Are you right handed or left handed? right   Are you currently employed ? 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 Partner    What type of home do you live in: 1 story or 2 story? 2 story 15 steps       Social Drivers of Corporate investment banker Strain: Low Risk  (02/04/2023)   Overall Financial Resource Strain (CARDIA)    Difficulty of Paying Living  Expenses: Not hard at all  Food Insecurity: No Food Insecurity (02/04/2023)   Hunger Vital Sign    Worried About Running Out of Food in the Last Year: Never true    Ran Out of Food in the Last Year: Never true  Transportation Needs: No Transportation Needs (02/04/2023)   PRAPARE - Administrator, Civil Service (Medical): No    Lack of Transportation (Non-Medical): No  Physical Activity: Sufficiently Active (02/04/2023)   Exercise Vital Sign    Days of Exercise per Week: 4 days    Minutes of Exercise per Session: 50 min  Stress: No Stress Concern Present (02/04/2023)   [employer] of Occupational Health - Occupational Stress Questionnaire    Feeling of Stress : Not at all  Social Connections: Socially Integrated (02/04/2023)   Social  Connection and Isolation Panel [NHANES]    Frequency of Communication with Friends and Family: More than three times a week    Frequency of Social Gatherings with Friends and Family: Once a week    Attends Religious Services: 1 to 4 times per year    Active Member of [employer] or Organizations: Yes    Attends Banker Meetings: 1 to 4 times per year    Marital Status: Living with partner    Vitals:   07/31/23 0815  BP: 116/64  Pulse: 76  Resp: 16  Temp: 98 F (36.7 C)  SpO2: 98%   Body mass index is 25.84 kg/m.  Physical Exam Vitals and nursing note reviewed.  Constitutional:      General: He is not in acute distress.    Appearance: He is well-developed.  HENT:     Head: Normocephalic and atraumatic.  Eyes:     Conjunctiva/sclera: Conjunctivae normal.  Cardiovascular:     Rate and Rhythm: Normal rate and regular rhythm.     Pulses:          Dorsalis pedis pulses are 2+ on the right side and 2+ on the left side.     Heart sounds: No murmur heard. Pulmonary:     Effort: Pulmonary effort is normal. No respiratory distress.     Breath sounds: Normal breath sounds.  Abdominal:     Palpations: Abdomen is soft. There is no hepatomegaly or mass.     Tenderness: There is no abdominal tenderness.  Musculoskeletal:     Left hand: Tenderness present. Decreased range of motion. Normal capillary refill. Normal pulse.       Arms:     Comments: DIP nodular tender lesion of left index finger. There is no fluctuant area. + Local heat and very tender upon light touch.  Skin:    General: Skin is warm.     Findings: Erythema present. No rash.     Comments: Left index finger with distal edema and erythema, affecting distal phalange, including periungual fold (right underneath finger nail and radial aspect).Small purplish area under finger nail. See picture.  Neurological:     Mental Status: He is alert and oriented to person, place, and time.     Cranial Nerves: No cranial  nerve deficit.     Gait: Gait normal.  Psychiatric:     Comments: Well groomed, good eye contact.       ASSESSMENT AND PLAN:  [name] was seen today for index finger pain and bruising.   Paronychia of index finger -     Sulfamethoxazole-Trimethoprim; Take 1 tablet by mouth 2 (two) times daily for 5 days.  Dispense: 10 tablet; Refill: 0  Discussed differential diagnosis, including mild cellulitis, paronychia, inflammatory arthritis, and inflamed tophi among some. Problem seems to be improving and now affecting distal phalanx. Will cover for possible infectious process with Bactrim DS twice daily x 5 days. Recommend soaking finger in warm water with Epsom salt for 15 minutes daily until problem resolved. Monitor for new symptoms. I do not think imaging or blood work are needed at this time. He was instructed about warning signs. If problem has not completely resolved in a couple weeks or if he gets worse, recommend follow-up with PCP.  I spent a total of 31 minutes in both face to face and non face to face activities for this visit on the date of this encounter. During this time history was obtained and documented, examination was performed, and assessment/plan discussed.  Return if symptoms worsen or fail to improve.  I, Rolla Etienne Wierda, acting as a scribe for Kiel Cockerell [place], MD., have documented all relevant documentation on the behalf of Sina Lucchesi [place], MD, as directed by  Jacori Mulrooney [place], MD while in the presence of Vandy Fong [place], MD.   I, Vishruth Seoane [place], MD, have reviewed all documentation for this visit. The documentation on 07/31/23 for the exam, diagnosis, procedures, and orders are all accurate and complete.  Dahna Hattabaugh G. [place], MD  [facility]. [place] office.

## 2023-07-31 NOTE — Patient Instructions (Addendum)
 A few things to remember from today's visit:  Paronychia of index finger - Plan: sulfamethoxazole-trimethoprim (BACTRIM DS) 800-160 MG tablet Vs gout tophi. We will treat with antibiotic. Soak finger in epson salt with warm water for 15 min daily. Monitor for changes.  If you need refills for medications you take chronically, please call your pharmacy. Do not use My Chart to request refills or for acute issues that need immediate attention. If you send a my chart message, it may take a few days to be addressed, specially if I am not in the office.  Please be sure medication list is accurate. If a new problem present, please set up appointment sooner than planned today.

## 2023-08-05 DIAGNOSIS — N200 Calculus of kidney: Secondary | ICD-10-CM | POA: Diagnosis not present

## 2023-08-05 DIAGNOSIS — H353131 Nonexudative age-related macular degeneration, bilateral, early dry stage: Secondary | ICD-10-CM | POA: Diagnosis not present

## 2023-08-10 DIAGNOSIS — M9902 Segmental and somatic dysfunction of thoracic region: Secondary | ICD-10-CM | POA: Diagnosis not present

## 2023-08-10 DIAGNOSIS — M9903 Segmental and somatic dysfunction of lumbar region: Secondary | ICD-10-CM | POA: Diagnosis not present

## 2023-08-10 DIAGNOSIS — M9901 Segmental and somatic dysfunction of cervical region: Secondary | ICD-10-CM | POA: Diagnosis not present

## 2023-08-12 DIAGNOSIS — L718 Other rosacea: Secondary | ICD-10-CM | POA: Diagnosis not present

## 2023-08-12 DIAGNOSIS — L821 Other seborrheic keratosis: Secondary | ICD-10-CM | POA: Diagnosis not present

## 2023-08-12 DIAGNOSIS — L218 Other seborrheic dermatitis: Secondary | ICD-10-CM | POA: Diagnosis not present

## 2023-08-28 DIAGNOSIS — M9903 Segmental and somatic dysfunction of lumbar region: Secondary | ICD-10-CM | POA: Diagnosis not present

## 2023-08-28 DIAGNOSIS — M9901 Segmental and somatic dysfunction of cervical region: Secondary | ICD-10-CM | POA: Diagnosis not present

## 2023-08-28 DIAGNOSIS — M9902 Segmental and somatic dysfunction of thoracic region: Secondary | ICD-10-CM | POA: Diagnosis not present

## 2023-09-01 ENCOUNTER — Ambulatory Visit: Payer: Medicare PPO | Admitting: Family Medicine

## 2023-09-08 ENCOUNTER — Encounter: Payer: Self-pay | Admitting: Family Medicine

## 2023-09-08 ENCOUNTER — Ambulatory Visit: Admitting: Family Medicine

## 2023-09-08 VITALS — BP 120/76 | HR 80 | Temp 98.0°F | Ht 69.0 in | Wt 176.0 lb

## 2023-09-08 DIAGNOSIS — M25442 Effusion, left hand: Secondary | ICD-10-CM

## 2023-09-08 DIAGNOSIS — L539 Erythematous condition, unspecified: Secondary | ICD-10-CM | POA: Diagnosis not present

## 2023-09-08 DIAGNOSIS — T148XXA Other injury of unspecified body region, initial encounter: Secondary | ICD-10-CM

## 2023-09-08 DIAGNOSIS — Z8739 Personal history of other diseases of the musculoskeletal system and connective tissue: Secondary | ICD-10-CM

## 2023-09-08 NOTE — Progress Notes (Signed)
 Acute Office Visit   Subjective:  Patient ID: Clifford Garcia, male    DOB: 1945-10-16, 78 y.o.   MRN: [id number]  Chief Complaint  Patient presents with   Hand Pain    Finger swelling  X1 month red and painful    HPI:  Patient is here for an acute visit. He was seen on 07/31/2023 by Dr. [place] for pain, swelling, and bruising to his left index finger since 07/29/2023. He reported initially had edema, erythema, and pain in the whole finger, but limited to the distal area. He was prescribed Bactrim  BID for 5 days. No labs drawn.  Today, he returns reports the swelling improved some with taking antibiotic. Pain is about the same, only when he applies pressure to the anterior side of his finger. Swelling and erythema are still present, but bruising has improved. He is unable to move the joint.   He has a history of gout about 40 years ago in his big toe, but does not think this pain is as severe as his big toe pain was.   ROS See HPI above      Objective:   BP 120/76   Pulse 80   Temp 98 F (36.7 C) (Oral)   Ht 5\' 9"  (1.753 m)   Wt 176 lb (79.8 kg)   SpO2 96%   BMI 25.99 kg/m    Physical Exam Vitals reviewed.  Constitutional:      General: He is not in acute distress.    Appearance: Normal appearance. He is not ill-appearing, toxic-appearing or diaphoretic.  HENT:     Head: Normocephalic and atraumatic.  Eyes:     General:        Right eye: No discharge.        Left eye: No discharge.     Conjunctiva/sclera: Conjunctivae normal.  Cardiovascular:     Rate and Rhythm: Normal rate.  Pulmonary:     Effort: Pulmonary effort is normal. No respiratory distress.  Musculoskeletal:        General: Normal range of motion.     Comments: Left DIP joint of index finger: erythema, swelling, and limited ROM. See picture included.   Skin:    General: Skin is warm and dry.  Neurological:     General: No focal deficit present.     Mental Status: He is alert and oriented to  person, place, and time. Mental status is at baseline.  Psychiatric:        Mood and Affect: Mood normal.        Behavior: Behavior normal.        Thought Content: Thought content normal.        Judgment: Judgment normal.        Assessment & Plan:  Swelling of finger joint of left hand -     Uric acid -     CBC with Differential/Platelet  Erythema of joint -     Uric acid -     CBC with Differential/Platelet  History of gout -     Uric acid  -Ordered CBC for concern of elevated WBC for infection, uric acid level due to history of uric acid. Will notify patient of results by MyChart and phone call.  -Recommended to prescribe a 10 day supply of Bactrim  since the 5 days previously was helping. However, patient declined. He would like to wait until labs have returned.  -May need to order an x-ray of finger with concerns of infection in  the joint/bone.   Fortunata Betty, NP

## 2023-09-08 NOTE — Patient Instructions (Addendum)
-  Ordered CBC and uric acid level. Office will call with results and you will see in MyChart. -Will hold on antibiotics or further treatment until labs are back.

## 2023-09-09 ENCOUNTER — Ambulatory Visit: Payer: Self-pay | Admitting: Family Medicine

## 2023-09-09 LAB — CBC WITH DIFFERENTIAL/PLATELET
Basophils Absolute: 0.1 10*3/uL (ref 0.0–0.1)
Basophils Relative: 1.3 % (ref 0.0–3.0)
Eosinophils Absolute: 0.1 10*3/uL (ref 0.0–0.7)
Eosinophils Relative: 1.7 % (ref 0.0–5.0)
HCT: 45.6 % (ref 39.0–52.0)
Hemoglobin: 15.7 g/dL (ref 13.0–17.0)
Lymphocytes Relative: 22.3 % (ref 12.0–46.0)
Lymphs Abs: 1.5 10*3/uL (ref 0.7–4.0)
MCHC: 34.5 g/dL (ref 30.0–36.0)
MCV: 90.5 fl (ref 78.0–100.0)
Monocytes Absolute: 0.8 10*3/uL (ref 0.1–1.0)
Monocytes Relative: 10.9 % (ref 3.0–12.0)
Neutro Abs: 4.4 10*3/uL (ref 1.4–7.7)
Neutrophils Relative %: 63.8 % (ref 43.0–77.0)
Platelets: 197 10*3/uL (ref 150.0–400.0)
RBC: 5.04 Mil/uL (ref 4.22–5.81)
RDW: 13.3 % (ref 11.5–15.5)
WBC: 6.9 10*3/uL (ref 4.0–10.5)

## 2023-09-09 LAB — URIC ACID: Uric Acid, Serum: 6.1 mg/dL (ref 4.0–7.8)

## 2023-09-09 MED ORDER — SULFAMETHOXAZOLE-TRIMETHOPRIM 800-160 MG PO TABS
1.0000 | ORAL_TABLET | Freq: Two times a day (BID) | ORAL | 0 refills | Status: AC
Start: 2023-09-09 — End: 2023-09-19

## 2023-09-09 NOTE — Addendum Note (Signed)
 Addended by: Teodoro Feller B on: 09/09/2023 01:47 PM   Modules accepted: Orders

## 2023-09-10 ENCOUNTER — Other Ambulatory Visit

## 2023-09-10 ENCOUNTER — Ambulatory Visit (INDEPENDENT_AMBULATORY_CARE_PROVIDER_SITE_OTHER)

## 2023-09-10 DIAGNOSIS — S62601A Fracture of unspecified phalanx of left index finger, initial encounter for closed fracture: Secondary | ICD-10-CM | POA: Diagnosis not present

## 2023-09-10 DIAGNOSIS — M25442 Effusion, left hand: Secondary | ICD-10-CM

## 2023-09-11 NOTE — Addendum Note (Signed)
 Addended by: Teodoro Feller B on: 09/11/2023 08:16 AM   Modules accepted: Orders

## 2023-09-15 ENCOUNTER — Other Ambulatory Visit (INDEPENDENT_AMBULATORY_CARE_PROVIDER_SITE_OTHER)

## 2023-09-15 ENCOUNTER — Ambulatory Visit: Admitting: Orthopedic Surgery

## 2023-09-15 DIAGNOSIS — S62631A Displaced fracture of distal phalanx of left index finger, initial encounter for closed fracture: Secondary | ICD-10-CM | POA: Diagnosis not present

## 2023-09-15 DIAGNOSIS — M20019 Mallet finger of unspecified finger(s): Secondary | ICD-10-CM

## 2023-09-15 DIAGNOSIS — S62639A Displaced fracture of distal phalanx of unspecified finger, initial encounter for closed fracture: Secondary | ICD-10-CM | POA: Diagnosis not present

## 2023-09-15 DIAGNOSIS — M20012 Mallet finger of left finger(s): Secondary | ICD-10-CM

## 2023-09-15 NOTE — Progress Notes (Signed)
 ANDRAS GRUNEWALD - 78 y.o. male MRN [id number]  Date of birth: December 25, 1945  Office Visit Note: Visit Date: 09/15/2023 PCP: Francenia Ingle, NP Referred by: Francenia Ingle, NP  Subjective: No chief complaint on file.  HPI: Clifford Garcia is a pleasant 78 y.o. male who presents today for evaluation of a left index finger injury sustained approximately 3 to 4 weeks prior.  Injury mechanism described as punching a bag during taekwondo.  He was initially seen by primary care, however initial concern was potentially gouty arthritis versus possible infection, subsequently underwent x-rays 5 days prior which did show bony mallet injury.  Has not undergone any prior treatment or splinting to date.  Pertinent ROS were reviewed with the patient and found to be negative unless otherwise specified above in HPI.   Visit Reason: left index finger mallet injury Duration of symptoms: 3-4 weeks Hand dominance: right Occupation: retired Diabetic: No Smoking: No Heart/Lung History: none Blood Thinners:  none  Prior Testing/EMG: 09/10/23 Injections (Date):none Treatments:none Prior Surgery: none  Assessment & Plan: Visit Diagnoses:  1. Closed fracture of distal phalanx of finger with mallet deformity, initial encounter   2. 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  He does have a soft tissue component of this injury as well as the digit is held in a slightly flexed posture with inability for active extension.  Unfortunately, he had not been splinted from the time of his injury until now, injury being approximately 1 month prior.  I did explain to him that with bony mallet injuries we generally treat this with  extension splinting to allow for consolidation of the fracture and the terminal extensor mechanism.  X-rays were repeated today with a mallet splint which shows appropriate location of the DIP joint on the lateral view.  There remains the notable bone spur that has been fractured with proximal migration.  I explained that we can trial a nonoperative course of extension splinting with the current mallet splint, this may take somewhere between 6 to 8 weeks for appropriate consolidation and healing.  I explained that he needs to wear the splint full-time except for hygiene in order to allow for healing.  He expressed understanding, mallet splint was fitted today appropriately.  He will return in 4 weeks for repeat clinical and radiographic check.  He was mention today that should he have ongoing extensor lag with inability for fracture consolidation or terminal extensor healing, we can consider potential DIP fusion in the future given the significant underlying DIP arthritis.  I spent 45 minutes in the care of this patient today including review of previous documentation, imaging obtained, face-to-face time discussing all options regarding treatment and documenting the encounter.   Follow-up: No follow-ups on file.   Meds & Orders: No orders of the defined types were placed in this encounter.   Orders Placed This Encounter  Procedures   XR Finger Index Left     Procedures: No procedures performed      Clinical History: No specialty comments available.  He reports that he quit smoking about 38 years ago. His smoking use included cigarettes. He has never used smokeless tobacco.  Recent Labs    09/08/23 1505  LABURIC 6.1  Objective:   Vital Signs: There were no vitals taken for this visit.  Physical Exam  Gen: Well-appearing, in no acute distress; non-toxic CV: Regular Rate. Well-perfused. Warm.  Resp: Breathing unlabored on room air; no wheezing. Psych: Fluid speech in  conversation; appropriate affect; normal thought process  Ortho Exam Left index finger: - Notable tenderness over the dorsal aspect of the DIP, DIP remains in flexed posture approximately 20 degrees, passively correctable, unable to perform active extension at the DIP region, skin is intact, no significant erythema, nail plate is well-seated beneath the eponychial fold, no evidence of swan-neck deformity at the PIP region  Imaging: XR Finger Index Left Result Date: 09/15/2023 X-rays of the left index finger were obtained today, multiple views X-rays demonstrate bony mallet injury with notable peripheral spurring, dorsal osteophyte is the main component of the bony mallet injury.  DIP remains well located in all planes.   Past Medical/Family/Surgical/Social History: Medications & Allergies reviewed per EMR, new medications updated. 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/2013   Past Medical History:  Diagnosis Date   Arthritis    Eczema    Environmental allergies    History of gout    X 1 EPISODE AGE 62   History of kidney stones    Macular degeneration disease    Family History  Problem Relation Age of Onset   CAD Father    Spina bifida Sister    Leukemia Brother    Dementia Brother    Healthy Daughter    Past Surgical History:  Procedure Laterality Date   CATARACTS REMOVED     TOTAL HIP ARTHROPLASTY Right 10/18/2013   Procedure: RIGHT TOTAL HIP ARTHROPLASTY ANTERIOR APPROACH;  Surgeon: Bevin Bucks, MD;  Location: [facility];  Service: Orthopedics;  Laterality: Right;   Social History   Occupational History   Not on file  Tobacco Use   Smoking status: Former    Current packs/day: 0.00    Types: Cigarettes    Quit date:  10/13/1984    Years since quitting: 38.9   Smokeless tobacco: Never  Vaping Use   Vaping status: Never Used  Substance and Sexual Activity   Alcohol use: Yes    Comment: White wine 1x week   Drug use: Never   Sexual activity: Yes    Jerline Linzy Alvia Jointer, M.D. [facility], Hand Surgery

## 2023-10-02 DIAGNOSIS — M9902 Segmental and somatic dysfunction of thoracic region: Secondary | ICD-10-CM | POA: Diagnosis not present

## 2023-10-02 DIAGNOSIS — M9903 Segmental and somatic dysfunction of lumbar region: Secondary | ICD-10-CM | POA: Diagnosis not present

## 2023-10-02 DIAGNOSIS — M9901 Segmental and somatic dysfunction of cervical region: Secondary | ICD-10-CM | POA: Diagnosis not present

## 2023-10-12 DIAGNOSIS — M9903 Segmental and somatic dysfunction of lumbar region: Secondary | ICD-10-CM | POA: Diagnosis not present

## 2023-10-12 DIAGNOSIS — M9902 Segmental and somatic dysfunction of thoracic region: Secondary | ICD-10-CM | POA: Diagnosis not present

## 2023-10-12 DIAGNOSIS — M9901 Segmental and somatic dysfunction of cervical region: Secondary | ICD-10-CM | POA: Diagnosis not present

## 2023-10-13 ENCOUNTER — Ambulatory Visit: Admitting: Orthopedic Surgery

## 2023-10-13 ENCOUNTER — Other Ambulatory Visit: Payer: Self-pay

## 2023-10-13 DIAGNOSIS — M20012 Mallet finger of left finger(s): Secondary | ICD-10-CM

## 2023-10-13 NOTE — Progress Notes (Signed)
 Clifford Garcia - 78 y.o. male MRN [id number]  Date of birth: [birth year]  Office Visit Note: Visit Date: 10/13/2023 PCP: Francenia Ingle, NP Referred by: Francenia Ingle, NP  Subjective: No chief complaint on file.  HPI: Clifford Garcia is a pleasant 78 y.o. male who presents today for follow-up of a left index finger injury sustained approximately 8 weeks.  Injury mechanism described as punching a bag during taekwondo.  He was initially seen by primary care, however initial concern was potentially gouty arthritis versus possible infection, subsequently underwent x-rays 5 days prior which did show bony mallet injury.    Was placed into a mallet splint approximately 4 weeks after injury with notable bony displacement at that time.  He was unable to tolerate the mallet splint, did purchase an AlumaFoam splint to maintain extension at the DIP and PIP regions.  Still having ongoing flexion deformity of the index finger DIP region with associated pain.  Pertinent ROS were reviewed with the patient and found to be negative unless otherwise specified above in HPI.     Assessment & Plan: Visit Diagnoses:  1. Mallet finger of left hand     Plan: Extensive discussion was once again had with patient today regarding his left index finger bony mallet injury.  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  He does have a soft tissue component of this injury as well as the digit is held in a slightly flexed posture with inability for active extension.  Unfortunately, he had not been splinted from the time of his injury until approximately 4 weeks out.  He was unable to tolerate the mallet splint however he states he has been able to maintain extension in the current splint he is in.  X-rays were repeated today which shows the notable bone spur that has been fractured with  proximal migration.  At this juncture, I explained that the bony mallet injury is unlikely to heal with simple splinting.  Given the ongoing extensor lag with inability for fracture consolidation or terminal extensor healing, we can consider potential DIP fusion in the future given the significant underlying DIP arthritis.  For the time being, he will transition away from the splint and begin to do activities as tolerated moving forward.  I explained that he should return in approximately 6 weeks or so to check how he is doing with his activities, at that juncture should he be having ongoing pain or instability at the index finger DIP region that is precluding him from doing his activities, we will consider DIP fusion at that time.  Patient expressed full understanding.  I spent 30 minutes in the care of this patient today including review of previous documentation, imaging obtained, face-to-face time discussing all options regarding treatment and documenting the encounter.   Follow-up: No follow-ups on file.   Meds & Orders: No orders of the defined types were placed in this encounter.   Orders Placed This Encounter  Procedures   XR Finger Index Left     Procedures: No procedures performed      Clinical History: No specialty comments available.  He reports that he quit smoking about 39 years ago. His smoking use included cigarettes. He has never used smokeless tobacco.  Recent Labs    09/08/23 1505  LABURIC 6.1    Objective:   Vital Signs: There were no vitals taken  for this visit.  Physical Exam  Gen: Well-appearing, in no acute distress; non-toxic CV: Regular Rate. Well-perfused. Warm.  Resp: Breathing unlabored on room air; no wheezing. Psych: Fluid speech in conversation; appropriate affect; normal thought process  Ortho Exam Left index finger: - Notable tenderness over the dorsal aspect of the DIP, DIP remains in flexed posture approximately 20 degrees, passively  correctable, unable to perform active extension at the DIP region, skin is intact, no significant erythema, nail plate is well-seated beneath the eponychial fold, no evidence of swan-neck deformity at the PIP region  Imaging: No results found.   Past Medical/Family/Surgical/Social History: Medications & Allergies reviewed per EMR, new medications updated. 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/2013   Past Medical History:  Diagnosis Date   Arthritis    Eczema    Environmental allergies    History of gout    X 1 EPISODE AGE 83   History of kidney stones    Macular degeneration disease    Family History  Problem Relation Age of Onset   CAD Father    Spina bifida Sister    Leukemia Brother    Dementia Brother    Healthy Daughter    Past Surgical History:  Procedure Laterality Date   CATARACTS REMOVED     TOTAL HIP ARTHROPLASTY Right 10/18/2013   Procedure: RIGHT TOTAL HIP ARTHROPLASTY ANTERIOR APPROACH;  Surgeon: Bevin Bucks, MD;  Location: [facility];  Service: Orthopedics;  Laterality: Right;   Social History   Occupational History   Not on file  Tobacco Use   Smoking status: Former    Current packs/day: 0.00    Types: Cigarettes    Quit date: 10/13/1984    Years since quitting: 39.0   Smokeless tobacco: Never  Vaping Use   Vaping status: Never Used  Substance and Sexual Activity   Alcohol use: Yes    Comment: White wine 1x week   Drug use: Never   Sexual activity: Yes    Enos Muhl Alvia Jointer, M.D. [facility], Hand Surgery

## 2023-11-10 ENCOUNTER — Ambulatory Visit: Payer: Medicare PPO | Admitting: Neurology

## 2023-11-10 ENCOUNTER — Encounter: Payer: Self-pay | Admitting: Neurology

## 2023-11-10 VITALS — BP 127/66 | HR 72 | Ht 69.0 in | Wt 176.0 lb

## 2023-11-10 DIAGNOSIS — G629 Polyneuropathy, unspecified: Secondary | ICD-10-CM

## 2023-11-10 DIAGNOSIS — M79672 Pain in left foot: Secondary | ICD-10-CM | POA: Diagnosis not present

## 2023-11-10 NOTE — Progress Notes (Signed)
 NEUROLOGY FOLLOW UP OFFICE NOTE  Clifford Garcia [id number] 1945-06-27  HISTORY OF PRESENT ILLNESS: I had the pleasure of seeing Clifford Garcia in follow-up in the neurology clinic on 11/10/2023.  The patient was last seen 7 months ago for dizziness. He is alone in the office today. Records and images were personally reviewed where available.  Since his last visit, he has not felt any dizziness at all, he has been avoiding triggers. His concern today is left foot pain. He started noticing symptoms a couple of months ago, it is not daily, he was sitting at dinner and noticed dull pain over the dorsum of his foot. He states he feels a heaviness in the left medial ankle, no pain. He denies any paresthesias or burning sensation. He notices the pain when he is sitting still with foot flat on the floor, there is no pain when walking. He is active walking, doing yoga and Tai Chi wit no symptoms. He has pain in his left finger and shoulder. He denies any falls. He drinks 2 glasses of white wine a week. No falls.    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 He recalls having an episode 4 years ago where he felt dizzy/lightheaded when he woke up but still went to Taekwondo. He was doing an exercise using a stick, turned and lost his balance, falling and fracturing a rib. The dizziness quieted down until July 2024 when again at Taekwondo while doing their warmup, he felt lightheaded and had to stop. He has not been back since then. There is a positional component to his symptoms. He is fine when supine. When he does the downward dog position in Yoga, he feels the lightheaded sensation. His instructor put blocks to support his neck and he was able to do the posture without symptoms. Another time it occurs is when he was doing a 360 degree spin in Taekwondo. He does not have any  problems when doing Tai Chi. He recently went on a yoga trip to [place] and after being on a bus going down a narrow winding road, he was off balance. He found that sitting at the front with his feet planted on the ground helped. When he wakes up in the morning, he puts both feet on the ground before standing and does fine 90% of the time. He was given a prescription for Meclizine  and notes he seemed to have less symptoms when taking it but not an unbelievable difference. He denies any spinning sensation. No headaches, double vision, difficulty swallowing, nausea/vomiting, neck/back pain, focal numbness/tingling/weakness, bowel/bladder dysfunction. 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  I personally reviewed head CT without contrast done 11/2022 which did not show any acute changes. 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   Laboratory Data: TSH, vitamin B12, B1, ESR, CRP normal   PAST MEDICAL HISTORY: Past Medical History:  Diagnosis Date   Arthritis    Eczema    Environmental allergies    History of gout    X 1 EPISODE AGE 35   History of kidney stones    Macular degeneration disease     MEDICATIONS: Current Outpatient Medications on File Prior to Visit  Medication Sig Dispense Refill   desonide (DESOWEN) 0.05 % cream      No current  facility-administered medications on file prior to visit.    ALLERGIES: Allergies  Allergen Reactions   Tetracycline Hcl Diarrhea    Bloated stomach    Tetracyclines & Related Diarrhea    Bloated stomach     FAMILY HISTORY: Family History  Problem Relation Age of Onset   CAD Father    Spina bifida Sister    Leukemia Brother    Dementia Brother    Healthy Daughter     SOCIAL HISTORY: Social History   Socioeconomic History   Marital status: Significant Other    Spouse name: Not on file   Number of children: 2    Years of education: Not on file   Highest education level: Doctorate  Occupational History   Not on file  Tobacco Use   Smoking status: Former    Current packs/day: 0.00    Types: Cigarettes    Quit date: 10/13/1984    Years since quitting: 39.1   Smokeless tobacco: Never  Vaping Use   Vaping status: Never Used  Substance and Sexual Activity   Alcohol use: Yes    Comment: White wine 1x week   Drug use: Never   Sexual activity: Yes  Other Topics Concern   Not on file  Social History Narrative   Are you right handed or left handed? right   Are you currently employed ? 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 Partner    What type of home do you live in: 1 story or 2 story? 2 story 15 steps       Social Drivers of Corporate investment banker Strain: Low Risk  (09/07/2023)   Overall Financial Resource Strain (CARDIA)    Difficulty of Paying Living Expenses: Not hard at all  Food Insecurity: No Food Insecurity (09/07/2023)   Hunger Vital Sign    Worried About Running Out of Food in the Last Year: Never true    Ran Out of Food in the Last Year: Never true  Transportation Needs: No Transportation Needs (09/07/2023)   PRAPARE - Administrator, Civil Service (Medical): No    Lack of Transportation (Non-Medical): No  Physical Activity: Sufficiently Active (09/07/2023)   Exercise Vital Sign    Days of Exercise per Week: 4 days    Minutes of Exercise per Session: 50 min  Stress: No Stress Concern Present (09/07/2023)   [employer] of Occupational Health - Occupational Stress Questionnaire    Feeling of Stress : Not at all  Social Connections: Moderately Integrated (09/07/2023)   Social Connection and Isolation Panel    Frequency of Communication with Friends and Family: Twice a week    Frequency of Social Gatherings with Friends and Family: Twice a week    Attends Religious  Services: Never    Database administrator or Organizations: No    Attends Engineer, structural: 1 to 4 times per year    Marital Status: Living with partner  Intimate Partner Violence: Not At Risk (02/04/2023)   Humiliation, Afraid, Rape, and Kick questionnaire    Fear of Current or Ex-Partner: No    Emotionally Abused: No    Physically Abused: No    Sexually Abused: No     PHYSICAL EXAM: Vitals:   11/10/23 1423  BP: 127/66  Pulse: 72  SpO2: 97%   General: No acute  distress Head:  Normocephalic/atraumatic Skin/Extremities: No rash, no edema Neurological Exam: alert and awake. No aphasia or dysarthria. Fund of knowledge is appropriate. Attention and concentration are normal.   Cranial nerves: Pupils equal, round. Extraocular movements intact with no nystagmus. Visual fields full.  No facial asymmetry.  Motor: Bulk and tone normal, muscle strength 5/5 throughout with no pronator drift. Reflexes +2 throughout. Sensation intact to all modalities on both UE. Decreased cold on right foot, intact pin on both LE, decreased vibration sense to ankles. Finger to nose testing intact.  Gait narrow-based and steady, able to tandem walk adequately.  Romberg negative.   IMPRESSION: Clifford Garcia is a very pleasant 78 yo RH man with a history of nephrolithiasis who presented for dizziness that significantly improved with vestibular therapy for vestibular hypofunction. He denies any dizziness since last visit. 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 Follow-up in 6 months, call for any changes.   Thank you for allowing me to participate in his care.  Please do not hesitate to call for any questions or concerns.    Darice Shivers, M.D.   CC: Philippe Slade, NP

## 2023-11-10 NOTE — Patient Instructions (Addendum)
 Good to see you.  Try the Voltaren arthritis pain gel and see if this helps with your left foot. See Podiatry as well for evaluation  2. Follow-up in 6 months, call for any changes

## 2023-11-17 DIAGNOSIS — L718 Other rosacea: Secondary | ICD-10-CM | POA: Diagnosis not present

## 2023-11-24 ENCOUNTER — Ambulatory Visit: Admitting: Orthopedic Surgery

## 2023-11-24 ENCOUNTER — Other Ambulatory Visit (INDEPENDENT_AMBULATORY_CARE_PROVIDER_SITE_OTHER): Payer: Self-pay

## 2023-11-24 DIAGNOSIS — M20012 Mallet finger of left finger(s): Secondary | ICD-10-CM

## 2023-11-24 NOTE — Progress Notes (Signed)
 Clifford Garcia - 78 y.o. male MRN [id number]  Date of birth: 1946/01/12  Office Visit Note: Visit Date: 11/24/2023 PCP: Billy Philippe SAUNDERS, NP Referred by: Billy Philippe SAUNDERS, NP  Subjective: No chief complaint on file.  HPI: Clifford Garcia is a pleasant 78 y.o. male who presents today for follow-up of a left index finger injury sustained approximately 12 weeks prior.  Injury mechanism described as punching a bag during taekwondo.  He was initially seen by primary care, however initial concern was potentially gouty arthritis versus possible infection, subsequently underwent x-rays 5 days prior which did show bony mallet injury.    Was placed into a mallet splint approximately 4 weeks after injury with notable bony displacement at that time.  He was unable to tolerate the mallet splint, did purchase an AlumaFoam splint to maintain extension at the DIP and PIP regions.  Still having ongoing flexion deformity of the index finger DIP region, however his pain has decreased significantly.  There is some mild hyperextension of the PIP as well.  Pertinent ROS were reviewed with the patient and found to be negative unless otherwise specified above in HPI.     Assessment & Plan: Visit Diagnoses:  1. 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  He does have a soft tissue component of this injury as well as the digit is held in a slightly flexed posture with inability for active extension.  Unfortunately, he had not been splinted from the time of his injury until approximately 4 weeks out.   X-rays were repeated today which shows the notable bone spur that has been fractured with proximal migration.  Given the  ongoing extensor lag with inability for fracture consolidation or terminal extensor healing, we can consider potential DIP fusion in the future given the significant underlying DIP arthritis.  He also has some mild swanning of the digit with mild PIP hyperextension.  He is overall pleased with his function of his hand at this juncture, particular given that the pain has subsided.  I did explain that should he be having ongoing pain or instability at the index finger DIP region that is precluding him from doing his activities, we will consider DIP fusion at that time.  Patient expressed full understanding.    Follow-up: No follow-ups on file.   Meds & Orders: No orders of the defined types were placed in this encounter.   Orders Placed This Encounter  Procedures   XR Finger Index Left     Procedures: No procedures performed      Clinical History: No specialty comments available.  He reports that he quit smoking about 39 years ago. His smoking use included cigarettes. He has never used smokeless tobacco.  Recent Labs    09/08/23 1505  LABURIC 6.1    Objective:   Vital Signs: There were no vitals taken for this visit.  Physical Exam  Gen: Well-appearing, in no acute distress; non-toxic CV: Regular Rate. Well-perfused. Warm.  Resp: Breathing unlabored on room air; no wheezing. Psych: Fluid speech in conversation; appropriate affect; normal thought process  Ortho Exam Left index finger: - Minimal tenderness over the dorsal aspect of the DIP, DIP remains in flexed posture approximately 20  degrees, passively correctable, unable to perform active extension at the DIP region, skin is intact, no significant erythema, nail plate is well-seated beneath the eponychial fold, mild swan-neck deformity at the PIP region  Imaging: XR Finger Index Left Result Date: 11/24/2023 X-rays of the left index finger demonstrate previously known bony mallet fracture, dorsal displacement of the  fragment.  DIP joint does remain well located in all planes, no evidence of volar subluxation.  Slight hyperextension of the PIP is appreciated    Past Medical/Family/Surgical/Social History: Medications & Allergies reviewed per EMR, new medications updated. 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/2013   Past Medical History:  Diagnosis Date   Arthritis    Eczema    Environmental allergies    History of gout    X 1 EPISODE AGE 78   History of kidney stones    Macular degeneration disease    Family History  Problem Relation Age of Onset   CAD Father    Spina bifida Sister    Leukemia Brother    Dementia Brother    Healthy Daughter    Past Surgical History:  Procedure Laterality Date   CATARACTS REMOVED     TOTAL HIP ARTHROPLASTY Right 10/18/2013   Procedure: RIGHT TOTAL HIP ARTHROPLASTY ANTERIOR APPROACH;  Surgeon: Donnice JONETTA Car, MD;  Location: [facility];  Service: Orthopedics;  Laterality: Right;   Social History   Occupational History   Not on file  Tobacco Use   Smoking status: Former    Current packs/day: 0.00    Types: Cigarettes    Quit date: 10/13/1984    Years since quitting: 39.1   Smokeless tobacco: Never  Vaping Use   Vaping status: Never Used  Substance and Sexual Activity   Alcohol use: Yes    Comment: White wine 1x week   Drug use: Never   Sexual activity: Yes    Caliyah Sieh Afton Alderton, M.D. [facility], Hand Surgery

## 2023-11-30 DIAGNOSIS — M9901 Segmental and somatic dysfunction of cervical region: Secondary | ICD-10-CM | POA: Diagnosis not present

## 2023-11-30 DIAGNOSIS — M9903 Segmental and somatic dysfunction of lumbar region: Secondary | ICD-10-CM | POA: Diagnosis not present

## 2023-11-30 DIAGNOSIS — M9902 Segmental and somatic dysfunction of thoracic region: Secondary | ICD-10-CM | POA: Diagnosis not present

## 2024-02-03 DIAGNOSIS — N200 Calculus of kidney: Secondary | ICD-10-CM | POA: Diagnosis not present

## 2024-02-03 DIAGNOSIS — R399 Unspecified symptoms and signs involving the genitourinary system: Secondary | ICD-10-CM | POA: Diagnosis not present

## 2024-02-11 ENCOUNTER — Encounter: Payer: Self-pay | Admitting: Family Medicine

## 2024-02-11 ENCOUNTER — Ambulatory Visit: Admitting: Family Medicine

## 2024-02-11 VITALS — BP 120/76 | HR 67 | Temp 97.9°F | Ht 69.0 in | Wt 178.0 lb

## 2024-02-11 DIAGNOSIS — J029 Acute pharyngitis, unspecified: Secondary | ICD-10-CM

## 2024-02-11 MED ORDER — AMOXICILLIN-POT CLAVULANATE 875-125 MG PO TABS
1.0000 | ORAL_TABLET | Freq: Two times a day (BID) | ORAL | 0 refills | Status: AC
Start: 2024-02-11 — End: 2024-02-18

## 2024-02-11 NOTE — Patient Instructions (Addendum)
-  It was good to see you today.  -Prescribed Augmentin 875-125mg  tablet, take 1 tablet every 12 hours for 7 days for possible infection and soreness.  -Recommend to suck on a piece of hard candy or sour candy to see if this helps.  -Send a MyChart message after taking antibiotic to see update on how you are feeling.

## 2024-02-11 NOTE — Progress Notes (Signed)
   Established Patient Office Visit   Subjective:  Patient ID: Clifford Garcia, male    DOB: 02-01-1946  Age: 78 y.o. MRN: [id number]  Chief Complaint  Patient presents with   Sore Throat    HPI Patient reports for the last 1.5 week he has pain intermittent on the left side only when swallowing. Described as soreness. Denies fever or any other upper respiratory symptoms.  ROS See HPI above     Objective:   BP 120/76   Pulse 67   Temp 97.9 F (36.6 C) (Oral)   Ht 5' 9 (1.753 m)   Wt 178 lb (80.7 kg)   SpO2 98%   BMI 26.29 kg/m    Physical Exam Vitals reviewed.  Constitutional:      General: He is not in acute distress.    Appearance: Normal appearance. He is not ill-appearing, toxic-appearing or diaphoretic.  HENT:     Head: Normocephalic and atraumatic.     Mouth/Throat:     Mouth: Mucous membranes are moist. No oral lesions.     Pharynx: Oropharynx is clear. Uvula midline. No pharyngeal swelling, oropharyngeal exudate, posterior oropharyngeal erythema or uvula swelling.     Tonsils: No tonsillar exudate or tonsillar abscesses.  Eyes:     General:        Right eye: No discharge.        Left eye: No discharge.     Conjunctiva/sclera: Conjunctivae normal.  Cardiovascular:     Rate and Rhythm: Normal rate and regular rhythm.     Heart sounds: Normal heart sounds. No murmur heard.    No friction rub. No gallop.  Pulmonary:     Effort: Pulmonary effort is normal. No respiratory distress.     Breath sounds: Normal breath sounds.  Musculoskeletal:        General: Normal range of motion.  Lymphadenopathy:     Head:     Right side of head: No submental, submandibular, tonsillar, preauricular or posterior auricular adenopathy.     Left side of head: No submental, submandibular, tonsillar, preauricular or posterior auricular adenopathy.  Skin:    General: Skin is warm and dry.  Neurological:     General: No focal deficit present.     Mental Status: He is alert and  oriented to person, place, and time. Mental status is at baseline.  Psychiatric:        Mood and Affect: Mood normal.        Behavior: Behavior normal.        Thought Content: Thought content normal.        Judgment: Judgment normal.      Assessment & Plan:  Sore throat -     Amoxicillin-Pot Clavulanate; Take 1 tablet by mouth 2 (two) times daily for 7 days.  Dispense: 14 tablet; Refill: 0  -Unclear etiology. Concerns of tonsil stone or infection. No abnormal findings on physical exam.  -Prescribed Augmentin 875-125mg  tablet, take 1 tablet every 12 hours for 7 days for possible infection and soreness.  -Recommend to suck on a piece of hard candy or sour candy to see if this helps. Possible tonsil stone.  -Advised to send a MyChart message after taking antibiotic to see update on how he is feeling. If not improved, will need a referral to ENT.   Sonakshi Rolland, NP

## 2024-02-15 DIAGNOSIS — L820 Inflamed seborrheic keratosis: Secondary | ICD-10-CM | POA: Diagnosis not present

## 2024-02-15 DIAGNOSIS — L570 Actinic keratosis: Secondary | ICD-10-CM | POA: Diagnosis not present

## 2024-02-15 DIAGNOSIS — L718 Other rosacea: Secondary | ICD-10-CM | POA: Diagnosis not present

## 2024-02-16 DIAGNOSIS — H524 Presbyopia: Secondary | ICD-10-CM | POA: Diagnosis not present

## 2024-02-29 ENCOUNTER — Encounter: Payer: Self-pay | Admitting: Radiology

## 2024-03-09 ENCOUNTER — Encounter: Payer: Self-pay | Admitting: Neurology

## 2024-03-30 ENCOUNTER — Ambulatory Visit: Admitting: Neurology

## 2024-03-30 ENCOUNTER — Telehealth: Payer: Self-pay | Admitting: Neurology

## 2024-03-30 NOTE — Telephone Encounter (Signed)
 [facility] Call ID: [id number]  Viet Kemmerer (Self)   PH: [phone]  Reason: Caller states that he never got a call back when he called yesterday. He would like to speak to Dr.Aquino's nurse asap.

## 2024-03-31 NOTE — Telephone Encounter (Signed)
 Pt was called back all he needed was to confirm that his appointment was 04/04/2024 at 3pm.

## 2024-04-04 ENCOUNTER — Ambulatory Visit: Admitting: Neurology

## 2024-04-04 ENCOUNTER — Encounter: Payer: Self-pay | Admitting: Neurology

## 2024-04-04 ENCOUNTER — Telehealth: Admitting: Neurology

## 2024-04-04 VITALS — Ht 69.0 in | Wt 175.0 lb

## 2024-04-04 DIAGNOSIS — M79672 Pain in left foot: Secondary | ICD-10-CM

## 2024-04-04 DIAGNOSIS — M79671 Pain in right foot: Secondary | ICD-10-CM | POA: Diagnosis not present

## 2024-04-04 DIAGNOSIS — H832X3 Labyrinthine dysfunction, bilateral: Secondary | ICD-10-CM

## 2024-04-04 NOTE — Patient Instructions (Addendum)
 It's always a pleasure to see you! Proceed with Podiatry evaluation as discussed. See you in March! Call for any changes.

## 2024-04-04 NOTE — Progress Notes (Signed)
 Virtual Visit via Video Note  This visit type was conducted with patient consent. This format is felt to be most appropriate for this patient at this time. Physical exam was limited by quality of the video and audio technology used for the visit.    Consent was obtained for video visit:  Yes.   Answered questions that patient had about telehealth interaction:  Yes.   Patient is aware of the limitations, risks, security and privacy concerns of performing an evaluation and management service by telemedicine. The patient expressed understanding and agreed to proceed.  Pt location: Home Physician Location: office Name of referring provider:  Billy Philippe SAUNDERS, NP I connected with Maude DELENA Shores at patients initiation/request on 04/04/2024 at  3:00 PM EST by video enabled telemedicine application and verified that I am speaking with the correct person using two identifiers. Pt MRN:  [id number] Pt DOB:  02-10-1946 Video Participants:  Maude DELENA Shores   History of Present Illness:  The patient had a virtual video visit on 04/04/2024. He was last seen in the neurology clinic 5 months ago. He initially presented for dizziness which resolved with vestibular therapy. On his last visit, he reported occasional dull pain in the dorsum of his foot, heaviness in the left ankle. Since then, he has had pain in the ball of his foot on the right. His partner noticed this past month that he uses his toes to keep his flipflops on his foot. There is no numbness. There was one incident where he got out of the car and he was limping with his left foot due to ankle pain. Symptoms are intermittent, he can jog without issues, and regularly goes to yoga class three days a week able to dig his feet in without pain. There is no tingling, burning. He has a podiatrist but has not seen him for these issues.    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 He recalls having an episode 4 years ago where he felt dizzy/lightheaded when he woke up but still went to Taekwondo. He was doing an exercise using a stick, turned and lost his balance, falling and fracturing a rib. The dizziness quieted down until July 2024 when again at Taekwondo while doing their warmup, he felt lightheaded and had to stop. He has not been back since then. There is a positional component to his symptoms. He is fine when supine. When he does the downward dog position in Yoga, he feels the lightheaded sensation. His instructor put blocks to support his neck and he was able to do the posture without symptoms. Another time it occurs is when he was doing a 360 degree spin in Taekwondo. He does not have any problems when doing Tai Chi. He recently went on a yoga trip to [place] and after being on a bus going down a narrow winding road, he was off balance. He found that sitting at the front with his feet planted on the ground helped. When he wakes up in the morning, he puts both feet on the ground before standing and does fine 90% of the time. He was given a prescription for Meclizine  and notes he seemed to have less symptoms when taking it but not an unbelievable difference. He denies any spinning sensation. No headaches, double vision, difficulty swallowing, nausea/vomiting, neck/back pain, focal numbness/tingling/weakness, bowel/bladder dysfunction. He had a hearing test in  May 2024 indicating bilateral sensorineural hearing loss.  I personally reviewed head CT without contrast done 11/2022 which did not show any acute changes. 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   Laboratory Data: TSH, vitamin B12, B1, ESR, CRP normal    Current Outpatient Medications on File Prior to Visit  Medication Sig Dispense Refill   desonide (DESOWEN) 0.05 % cream       No current facility-administered medications on file prior to visit.     Observations/Objective:   Vitals:   04/04/24 1146  Weight: 175 lb (79.4 kg)  Height: 5' 9 (1.753 m)   GEN:  The patient appears stated age and is in NAD.  Neurological examination: Patient is awake, alert. No aphasia or dysarthria. Intact fluency and comprehension. Cranial nerves: Extraocular movements intact. No facial asymmetry. Motor: moves all extremities symmetrically, at least anti-gravity x 4.    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 No further dizziness. Main concern has been foot issues. He reported left foot pain on last visit, today he reports pain on the ball of his right foot and an incident of limping with his left foot due to ankle issues. I discussed that these appear to be primary foot issues rather than neuropathic pain, he was advised to follow-up with his podiatrist. Follow-up in 3 months, call for any changes.    Follow Up Instructions:   -I discussed the assessment and treatment plan with the patient. The patient was provided an opportunity to ask questions and all were answered. The patient agreed with the plan and demonstrated an understanding of the instructions.   The patient was advised to call back or seek an in-person evaluation if the symptoms worsen or if the condition fails to improve as anticipated.    Darice CHRISTELLA Shivers, MD

## 2024-04-08 ENCOUNTER — Ambulatory Visit: Payer: Self-pay | Admitting: *Deleted

## 2024-04-08 NOTE — Telephone Encounter (Signed)
 FYI Only or Action Required?: Action required by provider: request for appointment.  Patient was last seen in primary care on 02/11/2024 by Billy Philippe SAUNDERS, NP.  Called Nurse Triage reporting Rash.  Symptoms began several days ago.  Interventions attempted: OTC medications: hydrocortisone  cream.  Symptoms are: gradually worsening.  Triage Disposition: See Physician Within 24 Hours  Patient/caregiver understands and will follow disposition?: No, wishes to speak with PCP   Copied from CRM #[id number]. Topic: Clinical - Red Word Triage >> Apr 08, 2024  8:48 AM Clifford Garcia wrote: Red Word that prompted transfer to Nurse Triage: Patient is calling because he believes he may have an infection on his genital area ( burning sensation ) patient has also developed rashes on his buttocks that itch and burns as well. Reason for Disposition  Genital area rash  Answer Assessment - Initial Assessment Questions Patient states he has never had anything like this occur before and he is extremely uncomfortable. Patient is requesting an appointment this afternoon- he has another appointment at 10 today with client. Patient advised I would request an appointment- but he may be directed to UC. Patient is not sexually active- he states he used public toilet on Saturday and thinks he may have been exposed in that way.   1. APPEARANCE of RASH: What does the rash look like? (e.g., blisters, dry flaky skin, red spots, redness, sores)     Rash buttock, anus and testicles- red and stings 2. LOCATION: Where is the rash located?      See above 3. NUMBER: How many spots are there?      Multiple bumps- hard 4. SIZE: How big are the spots? (e.g., inches, cm; or compare to size of pinhead, tip of pen, eraser, pea)      Eraser size 5. ONSET: When did the rash start?      Monday 6. ITCHING: Does the rash itch? If Yes, ask: How bad is the itch?  (Scale 0-10; or none, mild, moderate, severe)     Yes-  mild 7. PAIN: Does the rash hurt? If Yes, ask: How bad is the pain?  (Scale 0-10; or none, mild, moderate, severe)     Yes- sensitive, burning 8. OTHER SYMPTOMS: Do you have any other symptoms? (e.g., fever)     Some burning urination- seems to have improved  Protocols used: Rash or Redness - Localized-A-AH

## 2024-04-09 ENCOUNTER — Inpatient Hospital Stay (HOSPITAL_COMMUNITY): Admission: RE | Admit: 2024-04-09 | Discharge: 2024-04-09 | Payer: Self-pay | Attending: Nurse Practitioner

## 2024-04-09 ENCOUNTER — Encounter (HOSPITAL_COMMUNITY): Payer: Self-pay

## 2024-04-09 VITALS — BP 130/82 | HR 99 | Temp 98.2°F | Resp 20

## 2024-04-09 DIAGNOSIS — R300 Dysuria: Secondary | ICD-10-CM

## 2024-04-09 DIAGNOSIS — R21 Rash and other nonspecific skin eruption: Secondary | ICD-10-CM

## 2024-04-09 DIAGNOSIS — L304 Erythema intertrigo: Secondary | ICD-10-CM

## 2024-04-09 LAB — POCT URINE DIPSTICK
Bilirubin, UA: NEGATIVE
Blood, UA: NEGATIVE
Glucose, UA: NEGATIVE mg/dL
Leukocytes, UA: NEGATIVE
Nitrite, UA: NEGATIVE
POC PROTEIN,UA: NEGATIVE
Spec Grav, UA: 1.025 (ref 1.010–1.025)
Urobilinogen, UA: 0.2 U/dL
pH, UA: 5.5 (ref 5.0–8.0)

## 2024-04-09 MED ORDER — TRIAMCINOLONE ACETONIDE 0.5 % EX OINT: 1.0000 | TOPICAL_OINTMENT | Freq: Two times a day (BID) | CUTANEOUS | 0 refills | Status: AC

## 2024-04-09 MED ORDER — PHENAZOPYRIDINE HCL 200 MG PO TABS: 200.0000 mg | ORAL_TABLET | ORAL | 0 refills | Status: AC

## 2024-04-09 NOTE — ED Triage Notes (Signed)
 Patient presents to the office for Dysuria x 3 days. Denies any symptoms.

## 2024-04-09 NOTE — ED Provider Notes (Signed)
 [facility]    CSN: [id number] Arrival date & time: 04/09/24  9146      History   Chief Complaint Chief Complaint  Patient presents with   Dysuria    HPI Clifford Garcia is a 78 y.o. male.   Discussed the use of AI scribe software for clinical note transcription with the patient, who gave verbal consent to proceed.   The patient is an 78 year old male who presents with genital discomfort and rash that began approximately one week ago after sitting on a public toilet in the restroom at [employer]. He reports developing a rash localized to the tip of the penis that appeared about three days after the restroom exposure. He has been treating the area with over-the-counter hydrocortisone  cream and notes some improvement in the appearance of the rash. The patient also reports penile pain described as a burning sensation at the tip of the penis that occurs with urination and persists intermittently even when not urinating. He states the burning sensation has been gradually improving each day but remains present.  Additionally, the patient reports noticing bumps on the left buttock that he has not been able to visualize but can feel. He describes this area as itchy but not painful and denies any burning or stinging sensation in that region. He also reports a raw and painful area within the left groin. The patient denies penile discharge, penile swelling, hematuria, abdominal pain, lower back pain, fever, nausea, or vomiting.  The patient is sexually active with one partner of 25 years and reports no sexual contact for approximately two weeks prior to symptom onset. He attributes his symptoms to contact with the toilet seat in the public restroom, believing the previous user may have had an infection.  The following sections of the patient's history were reviewed and updated as appropriate: allergies, current medications, past family history, past medical history, past social history,  past surgical history, and problem list.     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 Authorizing Provider  desonide (DESOWEN) 0.05 % cream  08/25/15  Yes [provider]  phenazopyridine  (PYRIDIUM ) 200 MG tablet Take 1 tablet (200 mg total) by mouth 3 (three) times daily at 8am, 3pm and bedtime for 2 days. 04/09/24 04/11/24 Yes Iola Lukes, FNP  triamcinolone  ointment (KENALOG ) 0.5 % Apply 1 Application topically 2 (two) times daily. Apply thin layer to affected areas twice a day for up to 2 weeks 04/09/24  Yes Iola Lukes, FNP    Family History Family History  Problem Relation Age of Onset  CAD Father    Spina bifida Sister    Leukemia Brother    Dementia Brother    Healthy Daughter     Social History Social History[1]   Allergies   Tetracycline hcl and Tetracyclines & related   Review of Systems Review of Systems  Constitutional:  Negative for fever.  Gastrointestinal:  Negative for abdominal pain, nausea and vomiting.  Genitourinary:  Positive for dysuria, frequency and penile pain. Negative for hematuria, penile discharge, penile  swelling, scrotal swelling and testicular pain.  Musculoskeletal:  Negative for back pain.  Skin:  Positive for rash.  All other systems reviewed and are negative.    Physical Exam Triage Vital Signs ED Triage Vitals [04/09/24 0916]  Encounter Vitals Group     BP 130/82     Girls Systolic BP Percentile      Girls Diastolic BP Percentile      Boys Systolic BP Percentile      Boys Diastolic BP Percentile      Pulse Rate 99     Resp 20     Temp 98.2 F (36.8 C)     Temp Source Oral     SpO2 99 %     Weight      Height      Head Circumference      Peak Flow      Pain Score      Pain Loc      Pain Education      Exclude from Growth Chart    No data found.  Updated Vital Signs BP 130/82 (BP Location: Left Arm)   Pulse 99   Temp 98.2 F (36.8 C) (Oral)   Resp 20   SpO2 99%   Visual Acuity Right Eye Distance:   Left Eye Distance:   Bilateral Distance:    Right Eye Near:   Left Eye Near:    Bilateral Near:     Physical Exam Vitals reviewed. Exam conducted with a chaperone present ANCEL Molly, CMA).  Constitutional:      General: He is awake. He is not in acute distress.    Appearance: Normal appearance. He is well-developed. He is not ill-appearing, toxic-appearing or diaphoretic.  HENT:     Head: Normocephalic.     Right Ear: Hearing normal.     Left Ear: Hearing normal.     Nose: Nose normal.     Mouth/Throat:     Mouth: Mucous membranes are moist.  Eyes:     General: Vision grossly intact.     Conjunctiva/sclera: Conjunctivae normal.  Cardiovascular:     Rate and Rhythm: Normal rate and regular rhythm.     Heart sounds: Normal heart sounds.  Pulmonary:     Effort: Pulmonary effort is normal.     Breath sounds: Normal breath sounds and air entry.  Genitourinary:    Comments: Normal, circumcised penis without swelling or discharge. An erythematous lesion is noted on the glans penis, just lateral to the urethral meatus. No vesicles or pustules are  present. Two excoriated areas are noted, which the patient reports are secondary to scratching with his nails. The lesion is painful to palpation, with no surrounding erythema beyond the lesion margins. Testes and scrotum are normal in appearance without tenderness or masses. Cremasteric reflex is present bilaterally. No inguinal lymphadenopathy is appreciated. Examination of the left buttock reveals a clustered group of erythematous vesicular lesions without drainage or tenderness. There is no surrounding erythema or swelling beyond the lesions. Musculoskeletal:  General: Normal range of motion.     Cervical back: Normal range of motion and neck supple.  Skin:    General: Skin is warm and dry.  Neurological:     General: No focal deficit present.     Mental Status: He is alert and oriented to person, place, and time.  Psychiatric:        Speech: Speech normal.        Behavior: Behavior is cooperative.      UC Treatments / Results  Labs (all labs ordered are listed, but only abnormal results are displayed) Labs Reviewed  POCT URINE DIPSTICK - Abnormal; Notable for the following components:      Result Value   Ketones, POC UA trace (5) (*)    All other components within normal limits  URINE CULTURE  HSV 1/2 PCR (SURFACE)  CYTOLOGY, (ORAL, ANAL, URETHRAL) ANCILLARY ONLY    EKG   Radiology No results found.  Procedures Procedures (including critical care time)  Medications Ordered in UC Medications - No data to display  Initial Impression / Assessment and Plan / UC Course  I have reviewed the triage vital signs and the nursing notes.  Pertinent labs & imaging results that were available during my care of the patient were reviewed by me and considered in my medical decision making (see chart for details).     The patient is an 78 year old male presenting with genital rash and dysuria of approximately one weeks duration. Symptoms include an erythematous, painful  lesion on the glans penis with associated burning during urination that persists intermittently even at rest, as well as an itchy but nonpainful clustered vesicular rash on the left buttock and a raw, painful area in the left groin. Symptoms have shown gradual improvement with over-the-counter hydrocortisone . Urinalysis is negative for urinary tract infection; however, urine culture has been sent for confirmation given persistent dysuria. The differential diagnosis includes contact dermatitis and intertrigo, with consideration for herpes simplex virus given the clustered vesicular lesions on the buttock. There is low suspicion for herpes zoster due to the absence of significant pain or burning in a dermatomal pattern. HSV swabs were obtained from both penile and buttock lesions, along with urethral STD testing. Triamcinolone  ointment was prescribed for the affected areas, and phenazopyridine  was prescribed for symptomatic relief of dysuria, with education provided regarding expected urine discoloration. The patient was counseled on proper hygiene measures including keeping the area clean and dry, avoiding moisture, and wearing loose-fitting clothing. He will be notified of test results if a change in management is required, otherwise results may be reviewed via MyChart. Follow-up with primary care is advised if symptoms fail to improve, and the patient was instructed to seek emergency care for worsening pain, spreading rash, fever, urinary retention, or new systemic symptoms.  Today's evaluation has revealed no signs of a dangerous process. Discussed diagnosis with patient and/or guardian. Patient and/or guardian aware of their diagnosis, possible red flag symptoms to watch out for and need for close follow up. Patient and/or guardian understands verbal and written discharge instructions. Patient and/or guardian comfortable with plan and disposition.  Patient and/or guardian has a clear mental status at this time,  good insight into illness (after discussion and teaching) and has clear judgment to make decisions regarding their care  Documentation was completed with the aid of voice recognition software. Transcription may contain typographical errors.  Final Clinical Impressions(s) / UC Diagnoses   Final diagnoses:  Dysuria  Intertrigo  Rash and  nonspecific skin eruption     Discharge Instructions      You were seen today for genital discomfort, a rash, and burning with urination. Your urine test did not show signs of a urinary tract infection, but a urine culture and additional swabs were sent to check for possible infections, including herpes simplex virus and other causes. Some rashes in the groin and genital area can be caused by skin irritation, moisture, or friction, and these can sometimes look similar to infections.  You were prescribed a topical steroid ointment to apply as directed to the affected areas on the penis, groin crease, and left buttock to help reduce inflammation, redness, and itching. You were also given medication to help with the burning sensation during urination; this medication can turn your urine a bright orange or red color, which is expected and harmless. To help symptoms improve, keep the area clean and dry, gently pat dry after bathing, avoid rubbing or scratching, and wear loose-fitting underwear and clothing. Avoid using scented soaps, wipes, or lotions in the area, as these can worsen irritation. Drinking plenty of fluids may also help with urinary discomfort.  You will be contacted if any test results require a change in treatment. Otherwise, you may view your results in MyChart. Follow up with your primary care provider if symptoms do not continue to improve over the next several days, if the rash spreads, or if discomfort persists despite treatment. Seek emergency care right away if you develop fever, severe or worsening pain, inability to urinate, blood in the urine,  rapidly spreading redness, swelling of the penis or groin, or any new concerning symptoms.     ED Prescriptions     Medication Sig Dispense Auth. Provider   triamcinolone  ointment (KENALOG ) 0.5 % Apply 1 Application topically 2 (two) times daily. Apply thin layer to affected areas twice a day for up to 2 weeks 30 g Iola Lukes, FNP   phenazopyridine  (PYRIDIUM ) 200 MG tablet Take 1 tablet (200 mg total) by mouth 3 (three) times daily at 8am, 3pm and bedtime for 2 days. 6 tablet Iola Lukes, FNP      PDMP not reviewed this encounter.     [1]  Social History Tobacco Use   Smoking status: Former    Current packs/day: 0.00    Types: Cigarettes    Quit date: 10/13/1984    Years since quitting: 39.5   Smokeless tobacco: Never  Vaping Use   Vaping status: Never Used  Substance Use Topics   Alcohol use: Yes    Comment: White wine 1x week   Drug use: Never     Iola Lukes, FNP 04/09/24 1013

## 2024-04-09 NOTE — Discharge Instructions (Addendum)
 You were seen today for genital discomfort, a rash, and burning with urination. Your urine test did not show signs of a urinary tract infection, but a urine culture and additional swabs were sent to check for possible infections, including herpes simplex virus and other causes. Some rashes in the groin and genital area can be caused by skin irritation, moisture, or friction, and these can sometimes look similar to infections.  You were prescribed a topical steroid ointment to apply as directed to the affected areas on the penis, groin crease, and left buttock to help reduce inflammation, redness, and itching. You were also given medication to help with the burning sensation during urination; this medication can turn your urine a bright orange or red color, which is expected and harmless. To help symptoms improve, keep the area clean and dry, gently pat dry after bathing, avoid rubbing or scratching, and wear loose-fitting underwear and clothing. Avoid using scented soaps, wipes, or lotions in the area, as these can worsen irritation. Drinking plenty of fluids may also help with urinary discomfort.  You will be contacted if any test results require a change in treatment. Otherwise, you may view your results in MyChart. Follow up with your primary care provider if symptoms do not continue to improve over the next several days, if the rash spreads, or if discomfort persists despite treatment. Seek emergency care right away if you develop fever, severe or worsening pain, inability to urinate, blood in the urine, rapidly spreading redness, swelling of the penis or groin, or any new concerning symptoms.

## 2024-04-10 LAB — HSV 1/2 PCR (SURFACE)
HSV-1 DNA: NOT DETECTED
HSV-2 DNA: NOT DETECTED

## 2024-04-10 LAB — URINE CULTURE: Culture: NO GROWTH

## 2024-04-11 ENCOUNTER — Ambulatory Visit (HOSPITAL_COMMUNITY): Payer: Self-pay

## 2024-04-11 LAB — CYTOLOGY, (ORAL, ANAL, URETHRAL) ANCILLARY ONLY
Chlamydia: NEGATIVE
Comment: NEGATIVE
Comment: NEGATIVE
Comment: NORMAL
Neisseria Gonorrhea: NEGATIVE
Trichomonas: NEGATIVE

## 2024-04-12 ENCOUNTER — Ambulatory Visit

## 2024-04-12 DIAGNOSIS — M25572 Pain in left ankle and joints of left foot: Secondary | ICD-10-CM | POA: Diagnosis not present

## 2024-04-12 DIAGNOSIS — G8929 Other chronic pain: Secondary | ICD-10-CM | POA: Diagnosis not present

## 2024-04-12 DIAGNOSIS — M7752 Other enthesopathy of left foot: Secondary | ICD-10-CM

## 2024-04-12 DIAGNOSIS — M779 Enthesopathy, unspecified: Secondary | ICD-10-CM

## 2024-04-12 DIAGNOSIS — M7751 Other enthesopathy of right foot: Secondary | ICD-10-CM | POA: Diagnosis not present

## 2024-04-12 DIAGNOSIS — L989 Disorder of the skin and subcutaneous tissue, unspecified: Secondary | ICD-10-CM | POA: Diagnosis not present

## 2024-04-12 NOTE — Progress Notes (Unsigned)
 Subjective:  Patient ID: Clifford Garcia, male    DOB: December 27, 1945,  MRN: [id number]  Chief Complaint  Patient presents with   Foot Pain    Rm14 Patient complains of painful lesion right foot and numbness in left foot/ several months no treatment.    Discussed the use of AI scribe software for clinical note transcription with the patient, who gave verbal consent to proceed.  History of Present Illness Clifford Garcia is an 78 year old male with right foot skin lesion and callus who presents for evaluation of painful right foot lesions and intermittent left ankle pain.  He has severe pain from a lesion on the plantar right foot beneath the third metatarsal head. Pain is triggered by walking barefoot on hard surfaces and direct pressure, and is absent in sneakers and socks or during yoga. He has not tried treatment and denies swelling or erythema.  He has a right foot callus that he finds bothersome but not painful and without functional limitation.  He has had intermittent left ankle pain for several months, occurring rarely after holding the foot in certain positions such as while sitting in a car. Episodes cause brief limping and subjective weakness without instability or giving way. Pain is localized to the ankle, random, and not reproducible. He has a history of a right ankle fracture but has not sought treatment for current symptoms and denies swelling.     Review of Systems: Negative except as noted in the HPI. Denies N/V/F/Ch.  Past Medical History:  Diagnosis Date   Arthritis    Eczema    Environmental allergies    History of gout    X 1 EPISODE AGE 78   History of kidney stones    Macular degeneration disease    Current Medications[1]  Tobacco Use History[2]  Allergies[3] Objective:     Physical Exam MUSCULOSKELETAL: Hyperkeratotic lesion with central core between right second and third metatarsal heads. No surrounding erythema, edema, or signs of infection.  Pain to direct palpation. Decreased ankle joint dorsiflexion with knee extended, normal with knee flexed.  Intermittent pain in left ankle, not reproducible. Negative anterior drawer. No pain with ROM. 5/5 muscle strength to all major pedal muscle groups. No pain with direct palpation of ankle.    Radiographs: 5 weightbearing views of bilateral feet and ankle were taken today.  The left demonstrates a congruent ankle joint without significant arthropathy.  There is a well-defined ossicle just distal to the medial malleolus from previous injury.  Minor pes planus foot shape with faulting at the navicular cuneiform joint.  Mild increase in talonavicular joint uncoverage.  Right demonstrates a congruent ankle joint with mild joint space narrowing.  Well-healed fibular fracture.  Hallux abducto valgus deformity present.  Mild pes planus foot shape with faulting at the naviculocuneiform joint and mild increase in talonavicular joint uncoverage.   Assessment:   1. Tendinitis   2. Benign skin lesion   3. Chronic pain of left ankle      Plan:  Patient was evaluated and treated and all questions answered.  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 Recurrence possible. - Debrided porokeratosis sharply without incident.  Patient expressed satisfaction following removal of hyperkeratotic lesion. - Advised use of footwear to reduce pressure. - Educated on recurrence, further debridement, and topical acid application if lesion recurs. -Work on stretching Achilles to  decrease pressure on the metatarsal heads - Consider custom offloading orthotics  Left ankle pain Intermittent left ankle pain with occasional weakness, no instability, no acute injury, history of prior fracture. - Prescribed physical therapy for ankle pain and weakness. - Discussed local physical  therapy options and offered to send prescription to preferred location.   RTC after physical therapy  Prentice Ovens, DPM AACFAS Fellowship Trained Podiatric Surgeon [facility]     [1]  Current Outpatient Medications:    desonide (DESOWEN) 0.05 % cream, , Disp: , Rfl:    triamcinolone  ointment (KENALOG ) 0.5 %, Apply 1 Application topically 2 (two) times daily. Apply thin layer to affected areas twice a day for up to 2 weeks, Disp: 30 g, Rfl: 0 [2]  Social History Tobacco Use  Smoking Status Former   Current packs/day: 0.00   Types: Cigarettes   Quit date: 10/13/1984   Years since quitting: 39.5  Smokeless Tobacco Never  [3]  Allergies Allergen Reactions   Tetracycline Hcl Diarrhea    Bloated stomach    Tetracyclines & Related Diarrhea    Bloated stomach

## 2024-04-26 ENCOUNTER — Ambulatory Visit: Admitting: Family Medicine

## 2024-04-26 ENCOUNTER — Other Ambulatory Visit

## 2024-04-26 ENCOUNTER — Encounter: Payer: Self-pay | Admitting: Family Medicine

## 2024-04-26 ENCOUNTER — Ambulatory Visit: Payer: Self-pay | Admitting: Family Medicine

## 2024-04-26 VITALS — BP 126/70 | HR 96 | Temp 97.8°F | Ht 69.0 in | Wt 174.0 lb

## 2024-04-26 DIAGNOSIS — M779 Enthesopathy, unspecified: Secondary | ICD-10-CM

## 2024-04-26 DIAGNOSIS — M79672 Pain in left foot: Secondary | ICD-10-CM

## 2024-04-26 DIAGNOSIS — R350 Frequency of micturition: Secondary | ICD-10-CM | POA: Diagnosis not present

## 2024-04-26 DIAGNOSIS — E782 Mixed hyperlipidemia: Secondary | ICD-10-CM

## 2024-04-26 DIAGNOSIS — E663 Overweight: Secondary | ICD-10-CM | POA: Diagnosis not present

## 2024-04-26 LAB — LIPID PANEL
Cholesterol: 199 mg/dL (ref 28–200)
HDL: 58.4 mg/dL
LDL Cholesterol: 116 mg/dL — ABNORMAL HIGH (ref 10–99)
NonHDL: 140.19
Total CHOL/HDL Ratio: 3
Triglycerides: 122 mg/dL (ref 10.0–149.0)
VLDL: 24.4 mg/dL (ref 0.0–40.0)

## 2024-04-26 LAB — POCT URINALYSIS DIPSTICK
Bilirubin, UA: NEGATIVE
Blood, UA: NEGATIVE
Glucose, UA: NEGATIVE
Ketones, UA: NEGATIVE
Leukocytes, UA: NEGATIVE
Nitrite, UA: NEGATIVE
Protein, UA: NEGATIVE
Spec Grav, UA: 1.015
Urobilinogen, UA: NEGATIVE U/dL — AB
pH, UA: 6

## 2024-04-26 LAB — CBC WITH DIFFERENTIAL/PLATELET
Basophils Absolute: 0.1 K/uL (ref 0.0–0.1)
Basophils Relative: 0.8 % (ref 0.0–3.0)
Eosinophils Absolute: 0.1 K/uL (ref 0.0–0.7)
Eosinophils Relative: 1.3 % (ref 0.0–5.0)
HCT: 47.9 % (ref 39.0–52.0)
Hemoglobin: 16.6 g/dL (ref 13.0–17.0)
Lymphocytes Relative: 24 % (ref 12.0–46.0)
Lymphs Abs: 1.6 K/uL (ref 0.7–4.0)
MCHC: 34.6 g/dL (ref 30.0–36.0)
MCV: 90.6 fl (ref 78.0–100.0)
Monocytes Absolute: 0.5 K/uL (ref 0.1–1.0)
Monocytes Relative: 8 % (ref 3.0–12.0)
Neutro Abs: 4.4 K/uL (ref 1.4–7.7)
Neutrophils Relative %: 65.9 % (ref 43.0–77.0)
Platelets: 192 K/uL (ref 150.0–400.0)
RBC: 5.28 Mil/uL (ref 4.22–5.81)
RDW: 13.4 % (ref 11.5–15.5)
WBC: 6.7 K/uL (ref 4.0–10.5)

## 2024-04-26 LAB — COMPREHENSIVE METABOLIC PANEL WITH GFR
ALT: 20 U/L (ref 3–53)
AST: 21 U/L (ref 5–37)
Albumin: 4.4 g/dL (ref 3.5–5.2)
Alkaline Phosphatase: 48 U/L (ref 39–117)
BUN: 17 mg/dL (ref 6–23)
CO2: 31 meq/L (ref 19–32)
Calcium: 9.7 mg/dL (ref 8.4–10.5)
Chloride: 103 meq/L (ref 96–112)
Creatinine, Ser: 1.12 mg/dL (ref 0.40–1.50)
GFR: 61.85 mL/min
Glucose, Bld: 90 mg/dL (ref 70–99)
Potassium: 4 meq/L (ref 3.5–5.1)
Sodium: 141 meq/L (ref 135–145)
Total Bilirubin: 0.7 mg/dL (ref 0.2–1.2)
Total Protein: 7.4 g/dL (ref 6.0–8.3)

## 2024-04-26 LAB — HEMOGLOBIN A1C: Hgb A1c MFr Bld: 4.9 % (ref 4.6–6.5)

## 2024-04-26 LAB — TSH: TSH: 1.5 u[IU]/mL (ref 0.35–5.50)

## 2024-04-26 NOTE — Progress Notes (Signed)
 "  Established Patient Office Visit   Subjective:  Patient ID: Clifford Garcia, male    DOB: 16-Jun-1945  Age: 78 y.o. MRN: [id number]  Chief Complaint  Patient presents with   Medical Management of Chronic Issues    Discuss diabetes.     HPI Patient is present with concerns about having diabetes.  On 12/13, he was seen by [facility] at [facility] for genital discomfort and rash. He had a urinalysis completed with positive ketones. Negative urine culture, HSV, and STD testing. Diagnosed with dysuria, intertrigo, and rash/nonspecific skin eruption. He as prescribed Phenazopyridine  and Triamcinolone  Cream.   Then, he was seen on 12/19 with [facility] for dysuria and tinea cruris. He was prescribed Clotrimazole-Betamethasone cream. He had a urinalysis with no ketones present.   He reports he is concerned he has diabetes with the ketones present in his first urinalysis completed with urgent care. He reports increase in urination that urology is aware of, but started first week in December. Improved in urination. Denies increase in thirst or hungry. Last A1c was 5.2 on 12/23/2021.   Last, he is requesting a referral to physical therapy at [facility] location. He was seen at [facility] at [facility] on 12/16 where he was referred to physical therapy for left ankle pain and weakness and tendinitis. He reports he was referred to [facility] and does not want this location.   ROS See HPI above     Objective:   BP 126/70   Pulse 96   Temp 97.8 F (36.6 C) (Oral)   Ht 5' 9 (1.753 m)   Wt 174 lb (78.9 kg)   SpO2 99%   BMI 25.70 kg/m    Physical Exam Vitals reviewed.  Constitutional:      General: He is not in acute distress.    Appearance: Normal appearance. He is overweight. He is not ill-appearing, toxic-appearing or diaphoretic.  HENT:     Head: Normocephalic and atraumatic.  Eyes:     General:        Right eye: No discharge.         Left eye: No discharge.     Conjunctiva/sclera: Conjunctivae normal.  Cardiovascular:     Rate and Rhythm: Normal rate and regular rhythm.     Heart sounds: Normal heart sounds. No murmur heard.    No friction rub. No gallop.  Pulmonary:     Effort: Pulmonary effort is normal. No respiratory distress.     Breath sounds: Normal breath sounds.  Musculoskeletal:        General: Normal range of motion.  Skin:    General: Skin is warm and dry.  Neurological:     General: No focal deficit present.     Mental Status: He is alert and oriented to person, place, and time. Mental status is at baseline.  Psychiatric:        Mood and Affect: Mood normal.        Behavior: Behavior normal.        Thought Content: Thought content normal.        Judgment: Judgment normal.     Results for orders placed or performed in visit on 04/26/24  Comprehensive metabolic panel with GFR  Result Value Ref Range   Sodium 141 135 - 145 mEq/L   Potassium 4.0 3.5 - 5.1 mEq/L   Chloride 103 96 - 112 mEq/L   CO2 31 19 - 32 mEq/L   Glucose,  Bld 90 70 - 99 mg/dL   BUN 17 6 - 23 mg/dL   Creatinine, Ser 8.87 0.40 - 1.50 mg/dL   Total Bilirubin 0.7 0.2 - 1.2 mg/dL   Alkaline Phosphatase 48 39 - 117 U/L   AST 21 5 - 37 U/L   ALT 20 3 - 53 U/L   Total Protein 7.4 6.0 - 8.3 g/dL   Albumin 4.4 3.5 - 5.2 g/dL   GFR 38.14 >39.99 mL/min   Calcium 9.7 8.4 - 10.5 mg/dL  Hemoglobin J8r  Result Value Ref Range   Hgb A1c MFr Bld 4.9 4.6 - 6.5 %  CBC with Differential/Platelet  Result Value Ref Range   WBC 6.7 4.0 - 10.5 K/uL   RBC 5.28 4.22 - 5.81 Mil/uL   Hemoglobin 16.6 13.0 - 17.0 g/dL   HCT 52.0 60.9 - 47.9 %   MCV 90.6 78.0 - 100.0 fl   MCHC 34.6 30.0 - 36.0 g/dL   RDW 86.5 88.4 - 84.4 %   Platelets 192.0 150.0 - 400.0 K/uL   Neutrophils Relative % 65.9 43.0 - 77.0 %   Lymphocytes Relative 24.0 12.0 - 46.0 %   Monocytes Relative 8.0 3.0 - 12.0 %   Eosinophils Relative 1.3 0.0 - 5.0 %   Basophils Relative  0.8 0.0 - 3.0 %   Neutro Abs 4.4 1.4 - 7.7 K/uL   Lymphs Abs 1.6 0.7 - 4.0 K/uL   Monocytes Absolute 0.5 0.1 - 1.0 K/uL   Eosinophils Absolute 0.1 0.0 - 0.7 K/uL   Basophils Absolute 0.1 0.0 - 0.1 K/uL  Lipid panel  Result Value Ref Range   Cholesterol 199 28 - 200 mg/dL   Triglycerides 877.9 89.9 - 149.0 mg/dL   HDL 41.59 >60.99 mg/dL   VLDL 75.5 0.0 - 59.9 mg/dL   LDL Cholesterol 883 (H) 10 - 99 mg/dL   Total CHOL/HDL Ratio 3    NonHDL 140.19   TSH  Result Value Ref Range   TSH 1.50 0.35 - 5.50 uIU/mL  POC Urinalysis Dipstick  Result Value Ref Range   Color, UA yellow    Clarity, UA clear    Glucose, UA Negative Negative   Bilirubin, UA negative    Ketones, UA negative    Spec Grav, UA 1.015 1.010 - 1.025   Blood, UA negative    pH, UA 6.0 5.0 - 8.0   Protein, UA Negative Negative   Urobilinogen, UA negative (A) 0.2 or 1.0 E.U./dL   Nitrite, UA negative    Leukocytes, UA Negative Negative   Appearance     Odor      The ASCVD Risk score (Arnett DK, et al., 2019) failed to calculate for the following reasons:   The 2019 ASCVD risk score is only valid for ages 82 to 107   * - Cholesterol units were assumed    Assessment & Plan:  Increased frequency of urination -     Hemoglobin A1c -     POCT urinalysis dipstick  Mixed hyperlipidemia -     Comprehensive metabolic panel with GFR -     Lipid panel  Overweight (BMI 25.0-29.9) -     Comprehensive metabolic panel with GFR -     Hemoglobin A1c -     CBC with Differential/Platelet -     Lipid panel -     TSH  Tendonitis -     Ambulatory referral to Physical Therapy  Left foot pain -     Ambulatory referral  to Physical Therapy  -Urinalysis completed today with no ketones present or signs of infection.  -Ordered labs. Office will call with lab results and will be available via MyChart.  -Placed a referral to physical therapy for left ankle/foot pain and tendonitis. Please call the office or send a MyChart message  if you do not receive a phone call or a MyChart message about appointment in 2 weeks.   Alize Borrayo, NP "

## 2024-04-26 NOTE — Patient Instructions (Addendum)
-  It was great to see you today. I hope you have a great trip in [place].  -Urinalysis completed today with no ketones present or signs of infection.  -Ordered labs. Office will call with lab results and will be available via MyChart.  -Placed a referral to physical therapy for left ankle/foot pain and tendonitis. Please call the office or send a MyChart message if you do not receive a phone call or a MyChart message about appointment in 2 weeks.

## 2024-05-03 ENCOUNTER — Ambulatory Visit: Admitting: Family Medicine

## 2024-05-05 ENCOUNTER — Other Ambulatory Visit: Payer: Self-pay

## 2024-05-05 ENCOUNTER — Ambulatory Visit: Attending: Family Medicine

## 2024-05-05 DIAGNOSIS — M79672 Pain in left foot: Secondary | ICD-10-CM | POA: Diagnosis present

## 2024-05-05 DIAGNOSIS — R2689 Other abnormalities of gait and mobility: Secondary | ICD-10-CM | POA: Insufficient documentation

## 2024-05-05 DIAGNOSIS — M779 Enthesopathy, unspecified: Secondary | ICD-10-CM | POA: Diagnosis not present

## 2024-05-05 DIAGNOSIS — M25572 Pain in left ankle and joints of left foot: Secondary | ICD-10-CM | POA: Diagnosis not present

## 2024-05-05 NOTE — Therapy (Addendum)
 " OUTPATIENT PHYSICAL THERAPY LOWER EXTREMITY EVALUATION   Patient Name: Clifford Garcia MRN: [id number] DOB:Apr 02, 1946, 79 y.o., male Today's Date: 05/05/2024  END OF SESSION:  PT End of Session - 05/05/24 1030     Visit Number 1    Date for Recertification  06/30/24    Authorization Type Cohere- Auth requested    Progress Note Due on Visit 10    PT Start Time 0939    PT Stop Time 1026    PT Time Calculation (min) 47 min    Activity Tolerance Patient tolerated treatment well    Behavior During Therapy WFL for tasks assessed/performed          Past Medical History:  Diagnosis Date   Arthritis    Eczema    Environmental allergies    History of gout    X 1 EPISODE AGE 60   History of kidney stones    Macular degeneration disease    Past Surgical History:  Procedure Laterality Date   CATARACTS REMOVED     TOTAL HIP ARTHROPLASTY Right 10/18/2013   Procedure: RIGHT TOTAL HIP ARTHROPLASTY ANTERIOR APPROACH;  Surgeon: Donnice JONETTA Car, MD;  Location: [facility];  Service: Orthopedics;  Laterality: Right;   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  X-ray was negative.  Pt reports that he has not been doing yoga since the beginning of  December due to a rash and feels that stiffness in the ankle could be a result of not attending yoga.  He reports antalgia after sitting for long periods.    PERTINENT HISTORY: Rt THA  PAIN: 05/05/24 Are you having pain? Yes: NPRS scale: 0-7/10 Pain location: anterior ankle and dorsum of foot  Pain description: aching  Aggravating factors: sitting long periods in the car Relieving factors: nothing noted I just push through it   PRECAUTIONS: None  RED FLAGS: None   WEIGHT BEARING RESTRICTIONS: No  FALLS:  Has patient fallen in last 6 months? 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  DIAGNOSTIC FINDINGS: 05/05/24: x-ray: negative per pt report   PATIENT SURVEYS:  05/06/23: LEFS 67/80=83%   COGNITION: Overall cognitive status: Within functional limits for tasks assessed     SENSATION: WFL   MUSCLE LENGTH: Hamstrings limited by 25%, gastroc limited by 40% bil   POSTURE: rounded shoulders, forward  head, and flexed trunk   PALPATION: Min palpable tenderness over Lt anterior ankle joint at dorsum of foot.  Normal mobility at joint, limited DF due to gastroc tension with P/ROM . No edema noted.   LOWER EXTREMITY ROM:  Rt ankle: DF limited by 40%, Lt ankle: WFLs except DF limited by 40%   LOWER EXTREMITY MMT:  DF 4/5, PF 5/5, inversion 4+/5, eversion 4+/5   FUNCTIONAL TESTS:  Single Leg stance Rt: 4 seconds, Lt 3 seconds   GAIT: Distance walked: 50 feet  Assistive device utilized: None Level of assistance: Complete Independence Comments: no  angalgia noted today  Decreased active DF with step down bil so limited eccentric control                                                                                                                                TREATMENT DATE:  05/05/24:  Findings from evaluation discussed, pt educated on plan of care, HEP initiated.      PATIENT EDUCATION:  Education details: [email or web address] Person educated: Patient Education method: Explanation, Facilities Manager, and Handouts Education comprehension: verbalized understanding and returned demonstration  HOME EXERCISE PROGRAM: Access Code: [email or web address] URL: [email or web address] Date: 05/05/2024 Prepared by: Burnard  Exercises - Gastroc Stretch on Wall  - 2-3 x daily - 7 x weekly - 1 sets - 3 reps - 20 hold - Soleus Stretch on Wall  - 2-3 x daily - 7 x weekly - 1 sets - 3 reps - 20 hold - Long Sitting Calf Stretch with Strap  - 2-3 x daily - 7 x weekly - 1 sets - 3 reps - 20 hold - Standing Bilateral Gastroc Stretch with Step  - 2-3 x daily - 7 x weekly - 1 sets - 1-2 reps - 20 hold - Seated Hamstring Stretch  - 2-3 x daily - 7 x weekly - 1 sets - 3 reps - 20 hold ASSESSMENT:  CLINICAL IMPRESSION: Patient is a 79 y.o. male who was seen today for physical therapy evaluation and treatment for Lt foot and ankle pain/stiffness. This pain began 3 weeks ago, approximately 1 week after he stopped his regular yoga practice due to another medical issue.  Pt reports the most stiffness/pain after sitting in a car or chair for prolonged periods, producing antalgic gait.  Pt with reduced gastroc and hamstring length bilaterally.  Pt will plan to return to yoga this week and PT issued HEP for flexibility.  Pt was active in PT for balance and has not been doing those exercises. PT encouraged pt to return to these exercises for ankle stability and will review all next session.  Patient will benefit from skilled PT to address the below impairments and improve  overall function.   OBJECTIVE IMPAIRMENTS: Abnormal gait, decreased mobility, increased fascial restrictions, increased muscle spasms, impaired flexibility, and pain.   ACTIVITY LIMITATIONS: sitting and locomotion level  PARTICIPATION LIMITATIONS: driving and community activity  PERSONAL FACTORS:  Age and Time since onset of injury/illness/exacerbation are also affecting patient's functional outcome.   REHAB POTENTIAL: Good  CLINICAL DECISION MAKING: Stable/uncomplicated  EVALUATION COMPLEXITY: Low   GOALS: Goals reviewed with patient? Yes  SHORT TERM GOALS: Target date: 06/02/2024   Be independent in initial HEP Baseline: Goal status: INITIAL  2.  Return to yoga without limitation or modification due to Lt ankle stiffness/pain Baseline:  Goal status: INITIAL  3.  Report > or = to 25% reduction in frequency of Lt ankle stiffness after sitting  Baseline:  Goal status: INITIAL    LONG TERM GOALS: Target date: 06/30/2024    Be independent in advanced HEP Baseline:  Goal status: INITIAL  2.  Demonstrate symmetry with gait after sitting in the car or a chair for extended periods  Baseline:  Goal status: INITIAL  3.  Report > or = to 70% reduction in frequency of Lt ankle stiffness after sitting  Baseline:  Goal status: INITIAL  4.  Improve LEFS to > or = to 75/80 Baseline: 67/80 Goal status: INITIAL     PLAN:  PT FREQUENCY: 1x/week  PT DURATION: 8 weeks  PLANNED INTERVENTIONS: 97110-Therapeutic exercises, 97530- Therapeutic activity, 97112- Neuromuscular re-education, 97535- Self Care, 02859- Manual therapy, [phone]- Canalith repositioning, [id number]- Aquatic Therapy, [phone]- Electrical stimulation (unattended), [id number]- Electrical stimulation (manual), [id number]- Vasopneumatic device, [id number]- Ionotophoresis 4mg /ml Dexamethasone , 79439 (1-2 muscles), [id number] (3+ muscles)- Dry Needling, Patient/Family education, Balance training, Stair training, Taping, Joint mobilization, Joint  manipulation, Vestibular training, Cryotherapy, and Moist heat  PLAN FOR NEXT SESSION: review HEP, work on ankle stability (HEP issued from prior PT for balance), gait mechanics   Burnard Joy, PT 05/05/2024 10:31 AM   [facility] [address], [address] [place], [place] [address] Phone # [phone] Fax [phone]  "

## 2024-05-05 NOTE — Progress Notes (Deleted)
 " OUTPATIENT PHYSICAL THERAPY LOWER EXTREMITY EVALUATION   Patient Name: Clifford Garcia MRN: [id number] DOB:[birth year], 79 y.o., male Today's Date: 05/05/2024  END OF SESSION:  PT End of Session - 05/05/24 1030     Visit Number 1    Date for Recertification  06/30/24    Authorization Type [employer]here- Auth requested    Progress Note Due on Visit 10    PT Start Time 0939    PT Stop Time 1026    PT Time Calculation (min) 47 min    Activity Tolerance Patient tolerated treatment well    Behavior During Therapy WFL for tasks assessed/performed          Past Medical History:  Diagnosis Date   Arthritis    Eczema    Environmental allergies    History of gout    X 1 EPISODE AGE 74   History of kidney stones    Macular degeneration disease    Past Surgical History:  Procedure Laterality Date   CATARACTS REMOVED     TOTAL HIP ARTHROPLASTY Right 10/18/2013   Procedure: RIGHT TOTAL HIP ARTHROPLASTY ANTERIOR APPROACH;  Surgeon: Donnice JONETTA Car, MD;  Location: [facility];  Service: Orthopedics;  Laterality: Right;   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  X-ray was negative.  Pt reports that he has not been doing yoga since the beginning of December due to a rash and feels that stiffness in the ankle could be a result of not attending yoga.  He reports antalgia after sitting for long periods.    PERTINENT HISTORY: Rt THA  PAIN: 05/05/24 Are you having pain? Yes: NPRS scale: 0-7/10 Pain location: anterior ankle and dorsum of foot  Pain description: aching  Aggravating factors: sitting long periods in the car Relieving factors: nothing noted I just push through it   PRECAUTIONS: None  RED FLAGS: None   WEIGHT BEARING RESTRICTIONS: No  FALLS:  Has patient fallen in last 6 months? 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  DIAGNOSTIC FINDINGS: 05/05/24: x-ray: negative per pt report   PATIENT SURVEYS:  05/06/23: LEFS 67/80=83%   COGNITION: Overall cognitive status: Within functional limits for tasks assessed  SENSATION: WFL   MUSCLE LENGTH: Hamstrings limited by 25%, gastroc limited by 40% bil   POSTURE: rounded shoulders, forward head, and flexed trunk   PALPATION: Min palpable tenderness over Lt anterior ankle joint at dorsum of foot.  Normal mobility at joint, limited DF due to gastroc tension with P/ROM . No edema noted.   LOWER EXTREMITY ROM:  Rt ankle: DF limited by 40%, Lt ankle: WFLs except DF limited by 40%   LOWER EXTREMITY MMT:  DF 4/5, PF 5/5, inversion 4+/5, eversion 4+/5   FUNCTIONAL TESTS:  Single Leg stance Rt: 4 seconds, Lt 3 seconds    GAIT: Distance walked: 50 feet  Assistive device utilized: None Level of assistance: Complete Independence Comments: no angalgia noted today  Decreased active DF with step down bil so limited eccentric control                                                                                                                                TREATMENT DATE:  05/05/24:  Findings from evaluation discussed, pt educated on plan of care, HEP initiated.      PATIENT EDUCATION:  Education details: [email or web address] Person educated: Patient Education method: Explanation, Facilities Manager, and Handouts Education comprehension: verbalized understanding and returned demonstration  HOME EXERCISE PROGRAM: Access Code: [email or web address] URL: [email or web address] Date: 05/05/2024 Prepared by: Burnard  Exercises - Gastroc Stretch on Wall  - 2-3 x daily - 7 x weekly - 1 sets - 3 reps - 20 hold - Soleus Stretch on Wall  - 2-3 x daily - 7 x weekly - 1 sets - 3 reps - 20 hold - Long Sitting Calf Stretch with Strap  - 2-3 x daily - 7 x weekly - 1 sets - 3 reps - 20 hold - Standing Bilateral Gastroc Stretch with Step  - 2-3 x daily - 7 x weekly - 1 sets - 1-2 reps - 20 hold - Seated Hamstring Stretch  - 2-3 x daily - 7 x weekly - 1 sets - 3 reps - 20 hold ASSESSMENT:  CLINICAL IMPRESSION: Patient is a 79 y.o. male who was seen today for physical therapy evaluation and treatment for Lt foot and ankle pain/stiffness. This pain began 3 weeks ago, approximately 1 week after he stopped his regular yoga practice due to another medical issue.  Pt reports the most stiffness/pain after sitting in a car or chair for prolonged periods, producing antalgic gait.  Pt with reduced gastroc and hamstring length bilaterally.  Pt will plan to return to yoga this week and PT issued HEP for flexibility.  Pt was active in PT for balance and has not been doing those exercises. PT encouraged pt to return to these exercises for ankle  stability and will review all next session.  Patient will benefit from skilled PT to address the below impairments and improve overall function.   OBJECTIVE IMPAIRMENTS: Abnormal gait, decreased mobility, increased fascial restrictions, increased muscle spasms, impaired  flexibility, and pain.   ACTIVITY LIMITATIONS: sitting and locomotion level  PARTICIPATION LIMITATIONS: driving and community activity  PERSONAL FACTORS: Age and Time since onset of injury/illness/exacerbation are also affecting patient's functional outcome.   REHAB POTENTIAL: Good  CLINICAL DECISION MAKING: Stable/uncomplicated  EVALUATION COMPLEXITY: Low   GOALS: Goals reviewed with patient? Yes  SHORT TERM GOALS: Target date: 06/02/2024   Be independent in initial HEP Baseline: Goal status: INITIAL  2.  Return to yoga without limitation or modification due to Lt ankle stiffness/pain Baseline:  Goal status: INITIAL  3.  Report > or = to 25% reduction in frequency of Lt ankle stiffness after sitting  Baseline:  Goal status: INITIAL    LONG TERM GOALS: Target date: 06/30/2024    Be independent in advanced HEP Baseline:  Goal status: INITIAL  2.  Demonstrate symmetry with gait after sitting in the car or a chair for extended periods  Baseline:  Goal status: INITIAL  3.  Report > or = to 70% reduction in frequency of Lt ankle stiffness after sitting  Baseline:  Goal status: INITIAL  4.  Improve LEFS to > or = to 75/80 Baseline: 67/80 Goal status: INITIAL     PLAN:  PT FREQUENCY: 1x/week  PT DURATION: 8 weeks  PLANNED INTERVENTIONS: 97110-Therapeutic exercises, 97530- Therapeutic activity, 97112- Neuromuscular re-education, 97535- Self Care, 02859- Manual therapy, [id number]- Canalith repositioning, [id number]- Aquatic Therapy, [phone]- Electrical stimulation (unattended), [phone]- Electrical stimulation (manual), [id number]- Vasopneumatic device, [id number]- Ionotophoresis 4mg /ml Dexamethasone , 79439 (1-2 muscles),  20561 (3+ muscles)- Dry Needling, Patient/Family education, Balance training, Stair training, Taping, Joint mobilization, Joint manipulation, Vestibular training, Cryotherapy, and Moist heat  PLAN FOR NEXT SESSION: review HEP, work on ankle stability (HEP issued from prior PT for balance), gait mechanics   Burnard Joy, PT 05/05/2024 10:43 AM   [facility] [address], [address] [place], [place] [address] Phone # [phone] Fax [phone]  "

## 2024-05-12 ENCOUNTER — Ambulatory Visit

## 2024-05-12 DIAGNOSIS — M79672 Pain in left foot: Secondary | ICD-10-CM

## 2024-05-12 DIAGNOSIS — M25572 Pain in left ankle and joints of left foot: Secondary | ICD-10-CM

## 2024-05-12 DIAGNOSIS — R2689 Other abnormalities of gait and mobility: Secondary | ICD-10-CM

## 2024-05-12 NOTE — Therapy (Addendum)
 " OUTPATIENT PHYSICAL THERAPY TREATMENT   Patient Name: Clifford Garcia MRN: [id number] DOB:1946/04/22, 79 y.o., male Today's Date: 05/05/2024  END OF SESSION: 2      Date for Recertification  06/30/24     Authorization Type [employer]- Auth requested     Progress Note Due on Visit 10     PT Start Time 847    PT Stop Time 930    PT Time Calculation (min) 43 min     Activity Tolerance Patient tolerated treatment well     Behavior During Therapy WFL for tasks assessed/performed          Past Medical History:  Diagnosis Date   Arthritis    Eczema    Environmental allergies    History of gout    X 1 EPISODE AGE 87   History of kidney stones    Macular degeneration disease    Past Surgical History:  Procedure Laterality Date   CATARACTS REMOVED     TOTAL HIP ARTHROPLASTY Right 10/18/2013   Procedure: RIGHT TOTAL HIP ARTHROPLASTY ANTERIOR APPROACH;  Surgeon: Donnice JONETTA Car, MD;  Location: [facility];  Service: Orthopedics;  Laterality: Right;   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/2013    PCP: Billy Brink, NP  REFERRING PROVIDER: Billy Brink, NP  REFERRING DIAG:  Diagnosis  M77.9 (ICD-10-CM) - Tendonitis  [phone] (ICD-10-CM) - Left foot pain    THERAPY DIAG:  Pain in left foot  Pain of joint of left ankle and foot  Other abnormalities of gait and mobility  Rationale for Evaluation and Treatment: Rehabilitation  ONSET DATE: 04/11/24  SUBJECTIVE:   SUBJECTIVE STATEMENT: 90% improvement in my pain.   PERTINENT HISTORY: Rt THA  PAIN: 05/12/24 Are you having pain? Yes: NPRS scale: 0-7/10 Pain location: anterior ankle and dorsum of foot  Pain description: aching  Aggravating factors: sitting long  periods in the car Relieving factors: nothing noted I just push through it   PRECAUTIONS: None  RED FLAGS: None   WEIGHT BEARING RESTRICTIONS: No  FALLS:  Has patient fallen in last 6 months? 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  DIAGNOSTIC FINDINGS: 05/05/24: x-ray: negative per pt report   PATIENT SURVEYS:  05/06/23: LEFS 67/80=83%   COGNITION: Overall cognitive status: Within functional limits for tasks assessed     SENSATION: WFL   MUSCLE LENGTH: Hamstrings limited by 25%, gastroc limited by 40% bil   POSTURE: rounded shoulders, forward head, and flexed trunk   PALPATION: Min palpable tenderness over Lt anterior ankle joint at dorsum of foot.  Normal mobility at joint, limited DF due to gastroc tension with P/ROM . No edema noted.   LOWER EXTREMITY ROM:  Rt ankle: DF limited by 40%, Lt ankle: WFLs except DF limited by 40%   LOWER EXTREMITY MMT:  DF 4/5, PF 5/5, inversion 4+/5, eversion  4+/5   FUNCTIONAL TESTS:  Single Leg stance Rt: 4 seconds, Lt 3 seconds   GAIT: Distance walked: 50 feet  Assistive device utilized: None Level of assistance: Complete Independence Comments: no angalgia noted today  Decreased active DF with step down bil so limited eccentric control                                                                                                                                TREATMENT DATE:  05/12/24:  NuStep: level 5x 6 minutes -PT present to discuss progress  Review of HEP: standing gastroc and soleus stretch  at wall, seated hamstring stretch, standing stretch on step and gastroc stretch with strap Sit to stand 2x10 Alternating step taps 2x10 bil alteranating- first set was more challenging  Discussed vestibular symptoms with change of position and unlevel surfaces.   Eye tracking, Lt eye nystagmus with tracking to the Lt  05/05/24:  Findings from evaluation discussed, pt educated on plan of care, HEP initiated.      PATIENT EDUCATION:  Education details: [email or web address] Person educated: Patient Education method: Explanation, Facilities Manager, and Handouts Education comprehension: verbalized understanding and returned demonstration  HOME EXERCISE PROGRAM: Access Code: [email or web address] URL: [email or web address] Date: 05/05/2024 Prepared by: Burnard  Exercises - Gastroc Stretch on Wall  - 2-3 x daily - 7 x weekly - 1 sets - 3 reps - 20 hold - Soleus Stretch on Wall  - 2-3 x daily - 7 x weekly - 1 sets - 3 reps - 20 hold - Long Sitting Calf Stretch with Strap  - 2-3 x daily - 7 x weekly - 1 sets - 3 reps - 20 hold - Standing Bilateral Gastroc Stretch with Step  - 2-3 x daily - 7 x weekly - 1 sets - 1-2 reps - 20 hold - Seated Hamstring Stretch  - 2-3 x daily - 7 x weekly - 1 sets - 3 reps - 20 hold ASSESSMENT:  CLINICAL IMPRESSION: First time follow-up after evaluation.  Pt has returned to yoga and has been doing the stretches issued at evaluation.  He reports 90% overall improvement.  Pt required tactile and demo cues for gastroc/soleus stretch at wall Patient will benefit from skilled PT to address the below impairments and improve overall function.   OBJECTIVE IMPAIRMENTS: Abnormal gait, decreased mobility, increased fascial restrictions, increased muscle spasms, impaired flexibility, and pain.   ACTIVITY LIMITATIONS: sitting and locomotion level  PARTICIPATION LIMITATIONS: driving and community activity  PERSONAL FACTORS: Age and Time since onset of injury/illness/exacerbation are also  affecting patient's functional outcome.   REHAB POTENTIAL: Good  CLINICAL DECISION MAKING: Stable/uncomplicated  EVALUATION COMPLEXITY: Low   GOALS: Goals reviewed with patient? Yes  SHORT TERM GOALS: Target date: 06/02/2024   Be independent in initial HEP Baseline: Goal status: INITIAL  2.  Return to yoga without limitation or modification due to Lt ankle stiffness/pain Baseline:  Goal status: INITIAL  3.  Report >  or = to 25% reduction in frequency of Lt ankle stiffness after sitting  Baseline:  Goal status: INITIAL    LONG TERM GOALS: Target date: 06/30/2024    Be independent in advanced HEP Baseline:  Goal status: INITIAL  2.  Demonstrate symmetry with gait after sitting in the car or a chair for extended periods  Baseline:  Goal status: INITIAL  3.  Report > or = to 70% reduction in frequency of Lt ankle stiffness after sitting  Baseline:  Goal status: INITIAL  4.  Improve LEFS to > or = to 75/80 Baseline: 67/80 Goal status: INITIAL     PLAN:  PT FREQUENCY: 1x/week  PT DURATION: 8 weeks  PLANNED INTERVENTIONS: 97110-Therapeutic exercises, 97530- Therapeutic activity, 97112- Neuromuscular re-education, 97535- Self Care, 02859- Manual therapy, [phone]- Canalith repositioning, [id number]- Aquatic Therapy, [phone]- Electrical stimulation (unattended), [phone]- Electrical stimulation (manual), [id number]- Vasopneumatic device, [id number]- Ionotophoresis 4mg /ml Dexamethasone , 79439 (1-2 muscles), [id number] (3+ muscles)- Dry Needling, Patient/Family education, Balance training, Stair training, Taping, Joint mobilization, Joint manipulation, Vestibular training, Cryotherapy, and Moist heat  PLAN FOR NEXT SESSION: review HEP, work on ankle stability, assess for vestibular,  gait mechanics   Burnard Joy, PT 05/05/2024 10:31 AM   [facility] [address] [place], [place] [address] Phone # [phone] Fax [phone]  "

## 2024-05-19 ENCOUNTER — Ambulatory Visit

## 2024-05-19 DIAGNOSIS — M79672 Pain in left foot: Secondary | ICD-10-CM | POA: Diagnosis not present

## 2024-05-19 DIAGNOSIS — M25572 Pain in left ankle and joints of left foot: Secondary | ICD-10-CM

## 2024-05-19 DIAGNOSIS — R2689 Other abnormalities of gait and mobility: Secondary | ICD-10-CM

## 2024-05-19 NOTE — Therapy (Signed)
 " OUTPATIENT PHYSICAL THERAPY TREATMENT   Patient Name: Clifford Garcia MRN: [id number] DOB:12/09/1945, 79 y.o., male Today's Date: 05/05/2024  END OF SESSION: 2      Date for Recertification  06/30/24     Authorization Type [employer]- Auth requested     Progress Note Due on Visit 10     PT Start Time 847    PT Stop Time 930    PT Time Calculation (min) 43 min     Activity Tolerance Patient tolerated treatment well     Behavior During Therapy WFL for tasks assessed/performed          Past Medical History:  Diagnosis Date   Arthritis    Eczema    Environmental allergies    History of gout    X 1 EPISODE AGE 47   History of kidney stones    Macular degeneration disease    Past Surgical History:  Procedure Laterality Date   CATARACTS REMOVED     TOTAL HIP ARTHROPLASTY Right 10/18/2013   Procedure: RIGHT TOTAL HIP ARTHROPLASTY ANTERIOR APPROACH;  Surgeon: Donnice JONETTA Car, MD;  Location: [facility];  Service: Orthopedics;  Laterality: Right;   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/2013    PCP: Billy Brink, NP  REFERRING PROVIDER: Billy Brink, NP  REFERRING DIAG:  Diagnosis  M77.9 (ICD-10-CM) - Tendonitis  [phone] (ICD-10-CM) - Left foot pain    THERAPY DIAG:  Pain in left foot  Pain of joint of left ankle and foot  Other abnormalities of gait and mobility  Rationale for Evaluation and Treatment: Rehabilitation  ONSET DATE: 04/11/24  SUBJECTIVE:   SUBJECTIVE STATEMENT: 90% improvement in my pain in Lt foot/ankle.  I have Lt knee pain with some exercises.  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    PERTINENT HISTORY: Rt THA  PAIN: 05/19/24 Are you having pain? Yes:  NPRS scale: 0-4/10 Pain location: anterior ankle and dorsum of foot  Pain description: aching  Aggravating factors: sitting long periods in the car Relieving factors: nothing noted I just push through it   PRECAUTIONS: None  RED FLAGS: None   WEIGHT BEARING RESTRICTIONS: No  FALLS:  Has patient fallen in last 6 months? 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  DIAGNOSTIC FINDINGS: 05/05/24: x-ray: negative per pt report   PATIENT SURVEYS:  05/06/23: LEFS 67/80=83%   COGNITION: Overall cognitive status: Within functional limits for tasks assessed     SENSATION: WFL   MUSCLE LENGTH: Hamstrings limited by 25%, gastroc limited by 40% bil   POSTURE: rounded shoulders, forward head, and flexed trunk   PALPATION: Min palpable tenderness over Lt anterior ankle joint at dorsum of foot.  Normal mobility at joint, limited DF due to gastroc tension with P/ROM . No edema noted.   LOWER EXTREMITY ROM:  Rt ankle: DF limited by 40%, Lt ankle: WFLs except DF limited by 40%   LOWER EXTREMITY MMT:  DF 4/5, PF 5/5, inversion 4+/5, eversion 4+/5   FUNCTIONAL TESTS:  Single Leg stance Rt: 4 seconds, Lt 3 seconds   GAIT: Distance walked: 50 feet  Assistive device utilized: None Level of assistance: Complete Independence Comments: no angalgia noted today  Decreased active DF with step down bil so limited eccentric control                                                                                                                                 TREATMENT DATE:  05/19/24:  NuStep: level 5x 6 minutes -PT present to discuss progress  Seated hamstring stretch 3x20 seconds bil  Standing rockerboard x3 min  Alternating step taps 2x10 bil alteranating- first set was more challenging  Standing on balance pad: weight shifting 3 ways x 1 min each  Tandem stance on balance pad 2x30 seconds Rt and LT Sit to stand: pad in seat x5- reduced reps and did with pad in seat to help reduce Lt LE pain   05/12/24:  NuStep: level 5x 6 minutes -PT present to discuss progress  Review of HEP: standing gastroc and soleus stretch at wall, seated hamstring stretch, standing stretch on step and gastroc stretch with strap Sit to stand 2x10 Alternating step taps 2x10 bil alteranating- first set was more challenging  Discussed vestibular symptoms with change of position and unlevel surfaces.   Eye tracking, Lt eye nystagmus with tracking to the Lt  05/05/24:  Findings from evaluation discussed, pt educated on plan of care, HEP initiated.      PATIENT EDUCATION:  Education details: [email or web address] Person educated: Patient Education method: Explanation, Facilities Manager, and Handouts Education comprehension: verbalized understanding and returned demonstration  HOME EXERCISE PROGRAM: Access Code: [email or web address] URL: [email or web address] Date: 05/19/2024 Prepared by: Burnard  Exercises - Long Sitting Calf Stretch with Strap  - 2-3 x daily - 7 x weekly - 1 sets - 3 reps - 20 hold - Standing Bilateral Gastroc Stretch with Step  - 2-3 x daily - 7 x weekly - 1 sets - 1-2 reps - 20 hold - Seated Hamstring Stretch  - 2-3 x daily - 7 x weekly - 1 sets - 3 reps - 20 hold - Step Taps on Low Step  - 2 x daily - 7 x weekly - 1 sets - 10 reps - Standing on Foam Pad  - 1 x daily - 7 x weekly - 1 sets - 1 reps - 1 min  hold - Sit to Stand Without Arm Support  - 1 x daily - 7 x weekly - 2 sets - 5 reps - Wide Tandem Stance on Foam Pad with Eyes Open  - 1 x daily - 7 x  weekly - 1 sets - 3 reps - 30 hold ASSESSMENT:  CLINICAL IMPRESSION: Pt reports some Lt knee pain  and instability from a few of his exercises.  PT modified and added to HEP for more balance exercises.  He has a foam pad at home and PT added exercises to incorporate. Pt continues to experience intermittent dizziness and he will be assessed next session for this. Pt has returned to yoga and does not have any ankle pain with this.  He continues to report 90% overall improvement in his ankle symptoms. He has balance deficits and will continue to benefit from skilled PT to improve safety.   Patient will benefit from skilled PT to address the below impairments and improve overall function.   OBJECTIVE IMPAIRMENTS: Abnormal gait, decreased mobility, increased fascial restrictions, increased muscle spasms, impaired flexibility, and pain.   ACTIVITY LIMITATIONS: sitting and locomotion level  PARTICIPATION LIMITATIONS: driving and community activity  PERSONAL FACTORS: Age and Time since onset of injury/illness/exacerbation are also affecting patient's functional outcome.   REHAB POTENTIAL: Good  CLINICAL DECISION MAKING: Stable/uncomplicated  EVALUATION COMPLEXITY: Low   GOALS: Goals reviewed with patient? Yes  SHORT TERM GOALS: Target date: 06/02/2024   Be independent in initial HEP Baseline: Goal status: INITIAL  2.  Return to yoga without limitation or modification due to Lt ankle stiffness/pain Baseline:  Goal status: INITIAL  3.  Report > or = to 25% reduction in frequency of Lt ankle stiffness after sitting  Baseline:  Goal status: INITIAL    LONG TERM GOALS: Target date: 06/30/2024    Be independent in advanced HEP Baseline:  Goal status: INITIAL  2.  Demonstrate symmetry with gait after sitting in the car or a chair for extended periods  Baseline:  Goal status: INITIAL  3.  Report > or = to 70% reduction in frequency of Lt ankle stiffness after sitting  Baseline:  Goal  status: INITIAL  4.  Improve LEFS to > or = to 75/80 Baseline: 67/80 Goal status: INITIAL     PLAN:  PT FREQUENCY: 1x/week  PT DURATION: 8 weeks  PLANNED INTERVENTIONS: 97110-Therapeutic exercises, 97530- Therapeutic activity, 97112- Neuromuscular re-education, 97535- Self Care, 02859- Manual therapy, [phone]- Canalith repositioning, [id number]- Aquatic Therapy, [phone]- Electrical stimulation (unattended), [phone]- Electrical stimulation (manual), [id number]- Vasopneumatic device, [id number]- Ionotophoresis 4mg /ml Dexamethasone , 79439 (1-2 muscles), 20561 (3+ muscles)- Dry Needling, Patient/Family education, Balance training, Stair training, Taping, Joint mobilization, Joint manipulation, Vestibular training, Cryotherapy, and Moist heat  PLAN FOR NEXT SESSION: review HEP, work on ankle stability, balance exercises, assess for vestibular next   Burnard Joy, PT 05/05/2024 10:31 AM   [facility] [address] [place], [place] [address] Phone # [phone] Fax [phone]  "

## 2024-05-26 ENCOUNTER — Encounter: Payer: Self-pay | Admitting: Rehabilitative and Restorative Service Providers"

## 2024-05-26 ENCOUNTER — Ambulatory Visit: Admitting: Rehabilitative and Restorative Service Providers"

## 2024-05-26 DIAGNOSIS — R2689 Other abnormalities of gait and mobility: Secondary | ICD-10-CM

## 2024-05-26 DIAGNOSIS — M79672 Pain in left foot: Secondary | ICD-10-CM

## 2024-05-26 DIAGNOSIS — M25572 Pain in left ankle and joints of left foot: Secondary | ICD-10-CM

## 2024-05-26 NOTE — Therapy (Signed)
 " OUTPATIENT PHYSICAL THERAPY TREATMENT   Patient Name: Clifford Garcia MRN: [id number] DOB:06/13/1945, 79 y.o., male Today's Date: 05/05/2024  END OF SESSION:  PT End of Session - 05/26/24 0934     Visit Number 4    Date for Recertification  06/30/24    Authorization Type [employer]- 8 visits 1/8-06/30/24    Authorization - Visit Number 4    Authorization - Number of Visits 8    PT Start Time 0930    PT Stop Time 1010    PT Time Calculation (min) 40 min    Activity Tolerance Patient tolerated treatment well    Behavior During Therapy WFL for tasks assessed/performed            Past Medical History:  Diagnosis Date   Arthritis    Eczema    Environmental allergies    History of gout    X 1 EPISODE AGE 55   History of kidney stones    Macular degeneration disease    Past Surgical History:  Procedure Laterality Date   CATARACTS REMOVED     TOTAL HIP ARTHROPLASTY Right 10/18/2013   Procedure: RIGHT TOTAL HIP ARTHROPLASTY ANTERIOR APPROACH;  Surgeon: Donnice JONETTA Car, MD;  Location: [facility];  Service: Orthopedics;  Laterality: Right;   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/2013    PCP: Billy Brink, NP  REFERRING PROVIDER: Billy Brink, NP  REFERRING DIAG:  Diagnosis  M77.9 (ICD-10-CM) - Tendonitis  [phone] (ICD-10-CM) - Left foot pain    THERAPY DIAG:  Pain in left foot  Pain of joint of left ankle and foot  Other abnormalities of gait and mobility  Rationale for Evaluation and Treatment: Rehabilitation  ONSET DATE: 04/11/24  SUBJECTIVE:   SUBJECTIVE STATEMENT: Patient reports that his ankle is feeling pretty good.  Patient states that he is having some right shoulder pain today that is 7-9/10.   Patient reports that he has dizziness primarily when he gets up.  Patient denies any dizziness with negotiating the stairs at home.  Also reports dizziness when he is lying down and sits up and stands too quickly. Patient admits that he does not drink enough water daily.  PERTINENT HISTORY: Rt THA  PAIN: 05/26/24 Are you having pain? Yes: NPRS scale: 1/10 Pain location: anterior ankle and dorsum of foot  Pain description: aching  Aggravating factors: sitting long periods in the car Relieving factors: nothing noted I just push through it   PRECAUTIONS: None  RED FLAGS: None   WEIGHT BEARING RESTRICTIONS: No  FALLS:  Has patient fallen in last 6 months? 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  DIAGNOSTIC FINDINGS: 05/05/24: x-ray: negative per pt report   PATIENT SURVEYS:  05/06/23: LEFS 67/80=83%   COGNITION: Overall cognitive status: Within functional limits for tasks assessed     SENSATION: [facility]   MUSCLE  LENGTH: Hamstrings limited by 25%, gastroc limited by 40% bil   POSTURE: rounded shoulders, forward head, and flexed trunk   PALPATION: Min palpable tenderness over Lt anterior ankle joint at dorsum of foot.  Normal mobility at joint, limited DF due to gastroc tension with P/ROM . No edema noted.   LOWER EXTREMITY ROM:  Rt ankle: DF limited by 40%, Lt ankle: WFLs except DF limited by 40%   LOWER EXTREMITY MMT:  DF 4/5, PF 5/5, inversion 4+/5, eversion 4+/5   FUNCTIONAL TESTS:  Single Leg stance Rt: 4 seconds, Lt 3 seconds    GAIT: Distance walked: 50 feet  Assistive device utilized: None Level of assistance: Complete Independence Comments: no angalgia noted today  Decreased active DF with step down bil so limited eccentric control  VESTIBULAR ASSESSMENT:  05/26/2024: Smooth pursuit is WFL, but some loss of range noted Loss of range noted with convergence Saccades:  intact Dix Hallpike negative bilat Orthostatic Hypotension:   Supine after 5 min:  133/69, 62 bpm Standing after 1 min:  132/74, 74 bpm (with dizziness with first coming to stand) Standing after 3 min:  127/73, 75 bpm                                                                                                                                TREATMENT DATE:    05/26/2024: NuStep: level 5x 6 minutes -PT present to discuss progress Skin turgor pinch test to back of hand with increased time for skin to return to normal state Vestibular Assessment:  see above Trenda Craze negative bilat Orthostatic Hypotension measurements:  see above Alternating step taps 2x10 bil alternating Seated vertical and horizontal smooth pursuit x5 each Seated pencil push-ups x5 Standing in corner performing horizontal head turns x10 with patient self pacing and cuing to step out so he wasn't touching the wall.  Patient denied dizziness during task, but once he stopped, he started having dizziness.   05/19/24:  NuStep: level 5x 6 minutes -PT present to discuss progress  Seated hamstring stretch 3x20 seconds bil  Standing rockerboard x3 min  Alternating step taps 2x10 bil alteranating- first set was more challenging  Standing on balance pad: weight shifting 3 ways x 1 min each  Tandem stance on balance pad 2x30 seconds Rt and LT Sit to stand: pad in seat x5- reduced reps and did with pad in seat to help reduce Lt LE pain   05/12/24:  NuStep: level 5x 6 minutes -PT present to discuss progress  Review of HEP: standing gastroc and soleus stretch at wall,  seated hamstring stretch, standing stretch on step and gastroc stretch with strap Sit to stand 2x10 Alternating step taps 2x10 bil alteranating- first set was more challenging  Discussed vestibular symptoms with change of position and unlevel surfaces.   Eye tracking, Lt eye nystagmus with tracking to the Lt     PATIENT EDUCATION:  Education details: [place] Person educated:  Patient Education method: Explanation, Demonstration, and Handouts Education comprehension: verbalized understanding and returned demonstration  HOME EXERCISE PROGRAM:  Access Code: [email or web address] URL: [email or web address] Date: 05/26/2024 Prepared by: Jarrell Kamil Mchaffie  Exercises - Long Sitting Calf Stretch with Strap  - 2-3 x daily - 7 x weekly - 1 sets - 3 reps - 20 hold - Standing Bilateral Gastroc Stretch with Step  - 2-3 x daily - 7 x weekly - 1 sets - 1-2 reps - 20 hold - Seated Hamstring Stretch  - 2-3 x daily - 7 x weekly - 1 sets - 3 reps - 20 hold - Step Taps on Low Step  - 2 x daily - 7 x weekly - 1 sets - 10 reps - Standing on Foam Pad  - 1 x daily - 7 x weekly - 1 sets - 1 reps - 1 min  hold - Sit to Stand Without Arm Support  - 1 x daily - 7 x weekly - 2 sets - 5 reps - Wide Tandem Stance on Foam Pad with Eyes Open  - 1 x daily - 7 x weekly - 1 sets - 3 reps - 30 hold - Seated Horizontal Smooth Pursuit  - 1 x daily - 7 x weekly - 2 sets - 10 reps - Seated Vertical Smooth Pursuit  - 1 x daily - 7 x weekly - 2 sets - 10 reps - Seated Proximal-Distal Smooth Pursuit  - 1 x daily - 7 x weekly - 2 sets - 10 reps - Corner Balance Feet Together: Eyes Open With Head Turns  - 1 x daily - 7 x weekly - 2 sets - 15 reps  ASSESSMENT:  CLINICAL IMPRESSION:  Mr Groeneveld presents to skilled PT reporting that he left ankle/knee is feeling better, but he is having some right shoulder pain (states this is chronic in nature).  Patient reports that he has intermittent dizziness, especially with change in  position.  Patient reports dizziness when getting up from Nustep.  Asked patient about hydration and he admits that he has not had much/any water this morning.  Performed skin turgor test for dehydration, and it was abnormal.  Educated patient on the importance of adequate hydration and the role that dehydration can cause on various body systems.  Patient verbalizes understanding.  Patient reports eye muscle fatigue with smooth pursuit and has some decreased range of tracking noted.  Updated HEP to add in vestibular exercises and educated patient on vestibular hypofunctioning and how during vestibular rehab, he would perform exercises that would assist to strength his vestibular system.  Patient continues to require skilled PT to progress towards goal related activities.  OBJECTIVE IMPAIRMENTS: Abnormal gait, decreased mobility, increased fascial restrictions, increased muscle spasms, impaired flexibility, and pain.   ACTIVITY LIMITATIONS: sitting and locomotion level  PARTICIPATION LIMITATIONS: driving and community activity  PERSONAL FACTORS: Age and Time since onset of injury/illness/exacerbation are also affecting patient's functional outcome.   REHAB POTENTIAL: Good  CLINICAL DECISION MAKING: Stable/uncomplicated  EVALUATION COMPLEXITY: Low   GOALS: Goals reviewed with patient? Yes  SHORT TERM GOALS: Target date: 06/02/2024   Be independent in initial HEP Baseline: Goal status: Met on 05/26/2024  2.  Return to yoga without limitation or modification due to Lt ankle stiffness/pain Baseline:  Goal status: Ongoing  3.  Report > or = to 25% reduction in frequency of Lt ankle stiffness after sitting  Baseline:  Goal status: Met on 05/26/2024    LONG TERM GOALS: Target date: 06/30/2024  Be independent in advanced HEP Baseline:  Goal status: INITIAL  2.  Demonstrate symmetry with gait after sitting in the car or a chair for extended periods  Baseline:  Goal status:  INITIAL  3.  Report > or = to 70% reduction in frequency of Lt ankle stiffness after sitting  Baseline:  Goal status: INITIAL  4.  Improve LEFS to > or = to 75/80 Baseline: 67/80 Goal status: INITIAL     PLAN:  PT FREQUENCY: 1x/week  PT DURATION: 8 weeks  PLANNED INTERVENTIONS: 97110-Therapeutic exercises, 97530- Therapeutic activity, 97112- Neuromuscular re-education, 97535- Self Care, 02859- Manual therapy, [phone]- Canalith repositioning, [id number]- Aquatic Therapy, [phone]- Electrical stimulation (unattended), [phone]- Electrical stimulation (manual), [id number]- Vasopneumatic device, [id number]- Ionotophoresis 4mg /ml Dexamethasone , [id number] (1-2 muscles), [id number] (3+ muscles)- Dry Needling, Patient/Family education, Balance training, Stair training, Taping, Joint mobilization, Joint manipulation, Vestibular training, Cryotherapy, and Moist heat  PLAN FOR NEXT SESSION: review HEP, work on ankle stability, balance exercises, balance, vestibular rehab   Jarrell Laming, PT, DPT 05/26/24, 10:42 AM  [facility] [address] [place], [place] [address] Phone # [phone] Fax [phone]  "

## 2024-05-30 ENCOUNTER — Ambulatory Visit

## 2024-06-06 ENCOUNTER — Ambulatory Visit: Admitting: Rehabilitative and Restorative Service Providers"

## 2024-06-13 ENCOUNTER — Ambulatory Visit

## 2024-06-20 ENCOUNTER — Ambulatory Visit: Admitting: Rehabilitative and Restorative Service Providers"

## 2024-06-20 ENCOUNTER — Ambulatory Visit: Admitting: Neurology

## 2024-07-12 ENCOUNTER — Ambulatory Visit: Admitting: Neurology
